# Patient Record
Sex: Female | Born: 1965 | Race: Black or African American | Hispanic: No | Marital: Married | Smoking: Never smoker
Health system: Southern US, Community
[De-identification: ages and names within clinical notes are randomized; demographics above are authoritative.]

## PROBLEM LIST (undated history)

## (undated) DIAGNOSIS — A048 Other specified bacterial intestinal infections: Secondary | ICD-10-CM

## (undated) DIAGNOSIS — E669 Obesity, unspecified: Secondary | ICD-10-CM

## (undated) DIAGNOSIS — J302 Other seasonal allergic rhinitis: Secondary | ICD-10-CM

## (undated) DIAGNOSIS — D126 Benign neoplasm of colon, unspecified: Secondary | ICD-10-CM

## (undated) DIAGNOSIS — R7303 Prediabetes: Secondary | ICD-10-CM

## (undated) DIAGNOSIS — K648 Other hemorrhoids: Secondary | ICD-10-CM

## (undated) DIAGNOSIS — K5790 Diverticulosis of intestine, part unspecified, without perforation or abscess without bleeding: Secondary | ICD-10-CM

## (undated) HISTORY — DX: Obesity, unspecified: E66.9

## (undated) HISTORY — DX: Prediabetes: R73.03

## (undated) HISTORY — DX: Other specified bacterial intestinal infections: A04.8

## (undated) HISTORY — PX: NO PAST SURGERIES: SHX2092

## (undated) HISTORY — DX: Diverticulosis of intestine, part unspecified, without perforation or abscess without bleeding: K57.90

## (undated) HISTORY — DX: Other hemorrhoids: K64.8

## (undated) HISTORY — DX: Benign neoplasm of colon, unspecified: D12.6

---

## 2002-07-16 ENCOUNTER — Ambulatory Visit (HOSPITAL_COMMUNITY): Admission: RE | Admit: 2002-07-16 | Discharge: 2002-07-16 | Payer: Self-pay | Admitting: *Deleted

## 2002-07-16 ENCOUNTER — Encounter: Payer: Self-pay | Admitting: *Deleted

## 2002-11-10 ENCOUNTER — Inpatient Hospital Stay (HOSPITAL_COMMUNITY): Admission: AD | Admit: 2002-11-10 | Discharge: 2002-11-12 | Payer: Self-pay | Admitting: *Deleted

## 2002-11-10 ENCOUNTER — Encounter: Admission: RE | Admit: 2002-11-10 | Discharge: 2002-11-10 | Payer: Self-pay | Admitting: Family Medicine

## 2004-05-03 ENCOUNTER — Ambulatory Visit (HOSPITAL_COMMUNITY): Admission: RE | Admit: 2004-05-03 | Discharge: 2004-05-03 | Payer: Self-pay | Admitting: *Deleted

## 2004-07-10 ENCOUNTER — Inpatient Hospital Stay (HOSPITAL_COMMUNITY): Admission: AD | Admit: 2004-07-10 | Discharge: 2004-07-12 | Payer: Self-pay | Admitting: *Deleted

## 2004-07-10 ENCOUNTER — Ambulatory Visit: Payer: Self-pay | Admitting: Family Medicine

## 2004-07-10 ENCOUNTER — Ambulatory Visit: Payer: Self-pay | Admitting: Obstetrics & Gynecology

## 2006-10-10 ENCOUNTER — Ambulatory Visit: Payer: Self-pay | Admitting: Internal Medicine

## 2006-10-14 ENCOUNTER — Ambulatory Visit: Payer: Self-pay | Admitting: *Deleted

## 2007-03-03 ENCOUNTER — Ambulatory Visit (HOSPITAL_COMMUNITY): Admission: RE | Admit: 2007-03-03 | Discharge: 2007-03-03 | Payer: Self-pay | Admitting: Obstetrics & Gynecology

## 2007-04-15 ENCOUNTER — Inpatient Hospital Stay (HOSPITAL_COMMUNITY): Admission: AD | Admit: 2007-04-15 | Discharge: 2007-04-17 | Payer: Self-pay | Admitting: Obstetrics & Gynecology

## 2007-04-15 ENCOUNTER — Ambulatory Visit: Payer: Self-pay | Admitting: Obstetrics & Gynecology

## 2010-12-27 LAB — CBC
Hemoglobin: 11.4 — ABNORMAL LOW
MCHC: 34.7
Platelets: 148 — ABNORMAL LOW
RBC: 3.47 — ABNORMAL LOW
WBC: 12.6 — ABNORMAL HIGH
WBC: 8.9

## 2010-12-27 LAB — RPR: RPR Ser Ql: NONREACTIVE

## 2011-10-04 ENCOUNTER — Encounter: Payer: Self-pay | Admitting: Family

## 2011-10-29 ENCOUNTER — Ambulatory Visit (INDEPENDENT_AMBULATORY_CARE_PROVIDER_SITE_OTHER): Payer: Self-pay | Admitting: Obstetrics & Gynecology

## 2011-10-29 ENCOUNTER — Encounter: Payer: Self-pay | Admitting: Obstetrics & Gynecology

## 2011-10-29 VITALS — BP 128/93 | HR 71 | Temp 98.1°F | Ht 62.0 in | Wt 177.7 lb

## 2011-10-29 DIAGNOSIS — N939 Abnormal uterine and vaginal bleeding, unspecified: Secondary | ICD-10-CM

## 2011-10-29 DIAGNOSIS — N926 Irregular menstruation, unspecified: Secondary | ICD-10-CM

## 2011-10-29 LAB — CBC
HCT: 39 % (ref 36.0–46.0)
Hemoglobin: 12.7 g/dL (ref 12.0–15.0)
MCH: 29.2 pg (ref 26.0–34.0)
MCV: 89.7 fL (ref 78.0–100.0)
Platelets: 225 10*3/uL (ref 150–400)
RBC: 4.35 MIL/uL (ref 3.87–5.11)
WBC: 4.5 10*3/uL (ref 4.0–10.5)

## 2011-10-29 NOTE — Patient Instructions (Signed)
Return to clinic for any scheduled appointments or for any gynecologic concerns as needed.   

## 2011-10-29 NOTE — Progress Notes (Signed)
History:  46 y.o. [name] here today for evaluation of irregular menses over the past few years.  Encounter done with the help of a [place] interpreter.  She reports having periods lasting 2-3 days, sometimes going 2-3 months without periods, and then having 2 periods in one month. Periods are light. Last period was 09/27/11.  No other symptoms.    The following portions of the patient's history were reviewed and updated as appropriate: allergies, current medications, past family history, past medical history, past social history, past surgical history and problem list.  Review of Systems:  A comprehensive review of systems was negative.  Objective:  Physical Exam Blood pressure 128/93, pulse 71, temperature 98.1 F (36.7 C), temperature source Oral, height 5\' 2"  (1.575 m), weight 177 lb 11.2 oz (80.604 kg), last menstrual period 09/27/2011. Gen: NAD Abd: Soft, nontender and nondistended Pelvic: Normal appearing external genitalia; normal appearing vaginal mucosa and cervix.  Normal discharge.  Small uterus, no other palpable masses, no uterine or adnexal tenderness  Assessment & Plan:  Ordered labs and pelvic ultrasound Follow up results and manage accordingly. Next step if she is still amenorrheic will be a Provera challenge, if indicated by results.

## 2011-10-29 NOTE — Progress Notes (Signed)
[employer] interpreter # [phone]

## 2011-10-30 LAB — TESTOSTERONE, FREE, TOTAL, SHBG
Sex Hormone Binding: 51 nmol/L (ref 18–114)
Testosterone, Free: 2.1 pg/mL (ref 0.6–6.8)
Testosterone-% Free: 1.3 % (ref 0.4–2.4)
Testosterone: 15.39 ng/dL (ref 10–70)

## 2011-10-30 LAB — TSH: TSH: 1.004 u[IU]/mL (ref 0.350–4.500)

## 2011-10-31 ENCOUNTER — Other Ambulatory Visit: Payer: Self-pay | Admitting: Obstetrics & Gynecology

## 2011-10-31 DIAGNOSIS — Z1231 Encounter for screening mammogram for malignant neoplasm of breast: Secondary | ICD-10-CM

## 2011-11-06 ENCOUNTER — Telehealth: Payer: Self-pay | Admitting: *Deleted

## 2011-11-06 NOTE — Telephone Encounter (Signed)
Called pt w/[employer] interpreter # [phone]. The line was answered and went to a generic voice mail- no message left. Pt needs to be called back re: pelvic US ordered by Dr. Macon Large. Ask pt if she would like to have US performed (she has no insurance), then schedule appt. Pt will also need clinic follow up w/Dr. Macon Large (in Sept.) after US done.

## 2011-11-07 NOTE — Telephone Encounter (Signed)
Called pt w/[employer] interpreter # [phone].  I explained that Dr. Macon Large had ordered an ultrasound and asked if she would like it scheduled. Pt stated that she does not have income. She does not want the ultrasound scheduled at this time. Pt states she is also a pt of [facility] and received ibuprofen Rx. She has not had any bleeding for the month of July. I told pt to call [facility] or she may call us back for future Gyn problems or appts needed. Pt voiced understanding and stated she will keep mammogram appt as scheduled.

## 2011-11-19 ENCOUNTER — Ambulatory Visit (HOSPITAL_COMMUNITY)
Admission: RE | Admit: 2011-11-19 | Discharge: 2011-11-19 | Disposition: A | Discharge status: Home / Self Care | Payer: Self-pay | Source: Ambulatory Visit | Attending: Obstetrics & Gynecology | Admitting: Obstetrics & Gynecology

## 2011-11-19 DIAGNOSIS — Z1231 Encounter for screening mammogram for malignant neoplasm of breast: Secondary | ICD-10-CM

## 2011-11-22 ENCOUNTER — Other Ambulatory Visit: Payer: Self-pay | Admitting: Obstetrics & Gynecology

## 2011-11-22 DIAGNOSIS — R928 Other abnormal and inconclusive findings on diagnostic imaging of breast: Secondary | ICD-10-CM

## 2011-12-03 ENCOUNTER — Other Ambulatory Visit: Payer: Self-pay

## 2011-12-17 ENCOUNTER — Ambulatory Visit
Admission: RE | Admit: 2011-12-17 | Discharge: 2011-12-17 | Disposition: A | Discharge status: Home / Self Care | Payer: No Typology Code available for payment source | Source: Ambulatory Visit | Attending: Obstetrics & Gynecology | Admitting: Obstetrics & Gynecology

## 2011-12-17 DIAGNOSIS — R928 Other abnormal and inconclusive findings on diagnostic imaging of breast: Secondary | ICD-10-CM

## 2011-12-19 ENCOUNTER — Telehealth: Payer: Self-pay | Admitting: *Deleted

## 2011-12-19 NOTE — Telephone Encounter (Signed)
Called patient using [place] interpreter# [phone]. Pt informed of results, voiced understanding.

## 2011-12-19 NOTE — Telephone Encounter (Signed)
Message copied by Mannie Stabile on Wed Dec 19, 2011  3:24 PM ------      Message from: Alicia Bishop A      Created: Mon Dec 17, 2011 10:15 AM       Normal breast ultrasound and diagnostic mammogram. Please call to inform patient of results.

## 2012-07-10 ENCOUNTER — Emergency Department (HOSPITAL_COMMUNITY)
Admission: EM | Admit: 2012-07-10 | Discharge: 2012-07-10 | Disposition: A | Discharge status: Home / Self Care | Payer: No Typology Code available for payment source | Source: Home / Self Care | Attending: Family Medicine | Admitting: Family Medicine

## 2012-07-10 ENCOUNTER — Encounter (HOSPITAL_COMMUNITY): Payer: Self-pay | Admitting: *Deleted

## 2012-07-10 DIAGNOSIS — J020 Streptococcal pharyngitis: Secondary | ICD-10-CM

## 2012-07-10 HISTORY — DX: Other seasonal allergic rhinitis: J30.2

## 2012-07-10 MED ORDER — CEFDINIR 300 MG PO CAPS: 300.0000 mg | ORAL_CAPSULE | Freq: Two times a day (BID) | ORAL | Status: DC

## 2012-07-10 MED ORDER — IPRATROPIUM BROMIDE 0.06 % NA SOLN: 2.0000 | Freq: Four times a day (QID) | NASAL | Status: DC

## 2012-07-10 NOTE — ED Notes (Signed)
C/o headache, dry throat and pain on both sides of her neck onset 2 weeks ago.

## 2012-07-10 NOTE — ED Provider Notes (Signed)
History     CSN: [id number]  Arrival date & time 07/10/12  1534   First MD Initiated Contact with Patient 07/10/12 1549      Chief Complaint  Patient presents with  . Headache  . Sore Throat    (Consider location/radiation/quality/duration/timing/severity/associated sxs/prior treatment) Patient is a 47 y.o. female presenting with headaches and pharyngitis. The history is provided by the patient.  Headache Pain location:  Frontal Progression:  Unchanged Chronicity:  New Associated symptoms: congestion, ear pain, facial pain, sore throat and swollen glands   Sore Throat Associated symptoms include headaches.    Past Medical History  Diagnosis Date  . Seasonal allergies   . 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 No pertinent past surgical history.  History reviewed. No pertinent family history.  History  Substance Use Topics  . Smoking status: Never Smoker   . Smokeless tobacco: Never Used  . Alcohol Use: No    OB History   Grav Para Term Preterm Abortions TAB SAB Ect Mult Living   3 3 3       3       Review of Systems  Constitutional: Negative.   HENT: Positive for ear pain, congestion, sore throat and rhinorrhea.   Respiratory: Negative.   Cardiovascular: Negative.   Neurological: Positive for headaches.    Allergies  Review of patient's allergies indicates no known allergies.  Home Medications   Current Outpatient Rx  Name  Route  Sig  Dispense  Refill  . naproxen sodium (ANAPROX) 220 MG tablet   Oral   Take 440 mg by mouth 2 (two) times daily with a meal.         . cefdinir (OMNICEF) 300 MG capsule   Oral   Take 1 capsule (300 mg total) by mouth 2 (two) times daily.   20 capsule   0   . ibuprofen (ADVIL,MOTRIN) 200 MG tablet   Oral   Take 200 mg by mouth every 6 (six) hours as needed.         Marland Kitchen ipratropium (ATROVENT) 0.06 % nasal spray   Nasal   Place 2 sprays into the nose 4 (four) times daily.   15 mL   1     BP 118/74   Pulse 77  Temp(Src) 98.4 F (36.9 C) (Oral)  Resp 16  SpO2 97%  LMP 06/10/2012  Physical Exam  Nursing note and vitals reviewed. Constitutional: She is oriented to person, place, and time. She appears well-developed and well-nourished.  HENT:  Head: Normocephalic.  Right Ear: External ear normal.  Mouth/Throat: Uvula is midline and mucous membranes are normal. Oropharyngeal exudate and posterior oropharyngeal erythema present.  Neck: Normal range of motion. Neck supple.  Cardiovascular: Regular rhythm and normal heart sounds.   Pulmonary/Chest: Breath sounds normal.  Lymphadenopathy:    She has cervical adenopathy.  Neurological: She is alert and oriented to person, place, and time.  Skin: Skin is warm and dry.    ED Course  Procedures (including critical care time)  Labs Reviewed - No data to display No results found.   1. Strep sore throat       MDM          Linna Hoff, MD 07/10/12 1731

## 2013-01-26 ENCOUNTER — Telehealth: Payer: Self-pay | Admitting: General Practice

## 2013-01-26 NOTE — Telephone Encounter (Signed)
Called pt to schedule appt to see Dr. Daleen Squibb on 01/28/13.

## 2013-01-28 ENCOUNTER — Ambulatory Visit: Payer: Self-pay | Attending: Cardiology | Admitting: Cardiology

## 2013-01-28 ENCOUNTER — Encounter: Payer: Self-pay | Admitting: Cardiology

## 2013-01-28 VITALS — BP 134/82 | HR 99 | Temp 98.5°F | Resp 16 | Ht 62.0 in | Wt 179.0 lb

## 2013-01-28 DIAGNOSIS — Z0000 Encounter for general adult medical examination without abnormal findings: Secondary | ICD-10-CM | POA: Insufficient documentation

## 2013-01-28 DIAGNOSIS — Z09 Encounter for follow-up examination after completed treatment for conditions other than malignant neoplasm: Secondary | ICD-10-CM | POA: Insufficient documentation

## 2013-01-28 NOTE — Assessment & Plan Note (Signed)
I am delighted that her blood pressure is normal today. Will establish primary care here in our clinic in November. All questions answered and patient happy with this arrangement.

## 2013-01-28 NOTE — Progress Notes (Signed)
Alicia Bishop comes today for followup of her blood pressure. It was elevated at the health care fair this past Saturday.  She has no complaints today. Her blood pressure is normal. She does not have a primary care physician which we will arrange for mid November.

## 2013-01-28 NOTE — Progress Notes (Signed)
Pt is here today to establish care. Pt reports having no C.C. Today.

## 2013-02-02 ENCOUNTER — Other Ambulatory Visit: Payer: Self-pay

## 2013-02-23 ENCOUNTER — Ambulatory Visit: Payer: Self-pay | Attending: Internal Medicine | Admitting: Internal Medicine

## 2013-02-23 ENCOUNTER — Encounter: Payer: Self-pay | Admitting: Internal Medicine

## 2013-02-23 ENCOUNTER — Ambulatory Visit: Payer: Self-pay | Admitting: Internal Medicine

## 2013-02-23 VITALS — BP 136/88 | HR 87 | Temp 99.2°F | Ht 62.0 in | Wt 178.4 lb

## 2013-02-23 DIAGNOSIS — E119 Type 2 diabetes mellitus without complications: Secondary | ICD-10-CM | POA: Insufficient documentation

## 2013-02-23 DIAGNOSIS — Z0000 Encounter for general adult medical examination without abnormal findings: Secondary | ICD-10-CM

## 2013-02-23 LAB — CBC WITH DIFFERENTIAL/PLATELET
Eosinophils Relative: 4 % (ref 0–5)
Hemoglobin: 13.2 g/dL (ref 12.0–15.0)
Lymphocytes Relative: 45 % (ref 12–46)
Lymphs Abs: 2.3 10*3/uL (ref 0.7–4.0)
MCV: 87.5 fL (ref 78.0–100.0)
Monocytes Relative: 8 % (ref 3–12)
Neutrophils Relative %: 42 % — ABNORMAL LOW (ref 43–77)
Platelets: 239 10*3/uL (ref 150–400)
RBC: 4.49 MIL/uL (ref 3.87–5.11)
WBC: 5 10*3/uL (ref 4.0–10.5)

## 2013-02-23 LAB — POCT GLYCOSYLATED HEMOGLOBIN (HGB A1C): Hemoglobin A1C: 5.2

## 2013-02-23 NOTE — Progress Notes (Signed)
Pt is here to establish care and obtain an HBA1c. Pt has a reading of 136/88 blood pressure; maybe at risk for hypertension.

## 2013-02-24 LAB — TSH: TSH: 1.771 u[IU]/mL (ref 0.350–4.500)

## 2013-02-24 LAB — URINALYSIS, COMPLETE
Bilirubin Urine: NEGATIVE
Casts: NONE SEEN
Glucose, UA: NEGATIVE mg/dL
Hgb urine dipstick: NEGATIVE
pH: 6.5 (ref 5.0–8.0)

## 2013-02-24 LAB — CMP AND LIVER
ALT: 9 U/L (ref 0–35)
AST: 13 U/L (ref 0–37)
BUN: 14 mg/dL (ref 6–23)
Calcium: 9.2 mg/dL (ref 8.4–10.5)
Chloride: 110 mEq/L (ref 96–112)
Creat: 0.66 mg/dL (ref 0.50–1.10)
Total Bilirubin: 0.3 mg/dL (ref 0.3–1.2)

## 2013-02-24 LAB — LIPID PANEL
HDL: 50 mg/dL (ref >39)
Total CHOL/HDL Ratio: 2.8 Ratio
Triglycerides: 75 mg/dL (ref <150)

## 2013-02-24 NOTE — Progress Notes (Signed)
Patient ID: Alicia Bishop, female   DOB: 01/01/1966, 47 y.o.   MRN: [id number] Patient Demographics  Alicia Bishop, is a 47 y.o. female  [id number]  [id number]  DOB - Jul 30, 1965  CC:  Chief Complaint  Patient presents with  . Establish Care    requests HBA1c       HPI: Alicia Bishop is a 47 y.o. female here today to establish medical care. She went to a health fair recently and was told she is diabetic and her blood pressure was high. She was given instruction to register on our clinic for followup. She has no complaints, very pleasant woman. She does not smoke cigarette, she does not drink alcohol. She has no significant past medical history, not on any medication. Patient has No headache, No chest pain, No abdominal pain - No Nausea, No new weakness tingling or numbness, No Cough - SOB.  No Known Allergies Past Medical History  Diagnosis Date  . Seasonal allergies   . Diabetes in pregnancy    Current Outpatient Prescriptions on File Prior to Visit  Medication Sig Dispense Refill  . cefdinir (OMNICEF) 300 MG capsule Take 1 capsule (300 mg total) by mouth 2 (two) times daily.  20 capsule  0  . ibuprofen (ADVIL,MOTRIN) 200 MG tablet Take 200 mg by mouth every 6 (six) hours as needed.      Marland Kitchen ipratropium (ATROVENT) 0.06 % nasal spray Place 2 sprays into the nose 4 (four) times daily.  15 mL  1  . naproxen sodium (ANAPROX) 220 MG tablet Take 440 mg by mouth 2 (two) times daily with a meal.       No current facility-administered medications on file prior to visit.   No family history on file. History   Social History  . Marital Status: Married    Spouse Name: N/A    Number of Children: N/A  . Years of Education: N/A   Occupational History  . Not on file.   Social History Main Topics  . Smoking status: Never Smoker   . Smokeless tobacco: Never Used  . Alcohol Use: No  . Drug Use: No  . Sexual Activity: Yes    Partners: Male    Birth Control/ Protection: None    Other Topics Concern  . Not on file   Social History Narrative  . No narrative on file    Review of Systems: Constitutional: Negative for fever, chills, diaphoresis, activity change, appetite change and fatigue. HENT: Negative for ear pain, nosebleeds, congestion, facial swelling, rhinorrhea, neck pain, neck stiffness and ear discharge.  Eyes: Negative for pain, discharge, redness, itching and visual disturbance. Respiratory: Negative for cough, choking, chest tightness, shortness of breath, wheezing and stridor.  Cardiovascular: Negative for chest pain, palpitations and leg swelling. Gastrointestinal: Negative for abdominal distention. Genitourinary: Negative for dysuria, urgency, frequency, hematuria, flank pain, decreased urine volume, difficulty urinating and dyspareunia.  Musculoskeletal: Negative for back pain, joint swelling, arthralgia and gait problem. Neurological: Negative for dizziness, tremors, seizures, syncope, facial asymmetry, speech difficulty, weakness, light-headedness, numbness and headaches.  Hematological: Negative for adenopathy. Does not bruise/bleed easily. Psychiatric/Behavioral: Negative for hallucinations, behavioral problems, confusion, dysphoric mood, decreased concentration and agitation.    Objective:   Filed Vitals:   02/23/13 1725  BP: 136/88  Pulse: 87  Temp: 99.2 F (37.3 C)    Physical Exam: Constitutional: Patient appears well-developed and well-nourished. No distress. Obese HENT: Normocephalic, atraumatic, External right and left ear normal. Oropharynx is clear and moist.  Eyes: Conjunctivae  and EOM are normal. PERRLA, no scleral icterus. Neck: Normal ROM. Neck supple. No JVD. No tracheal deviation. No thyromegaly. CVS: RRR, S1/S2 +, no murmurs, no gallops, no carotid bruit.  Pulmonary: Effort and breath sounds normal, no stridor, rhonchi, wheezes, rales.  Abdominal: Soft. BS +, no distension, tenderness, rebound or guarding.   Musculoskeletal: Normal range of motion. No edema and no tenderness.  Lymphadenopathy: No lymphadenopathy noted, cervical, inguinal or axillary Neuro: Alert. Normal reflexes, muscle tone coordination. No cranial nerve deficit. Skin: Skin is warm and dry. No rash noted. Not diaphoretic. No erythema. No pallor. Psychiatric: Normal mood and affect. Behavior, judgment, thought content normal.  Lab Results  Component Value Date   WBC 5.0 02/23/2013   HGB 13.2 02/23/2013   HCT 39.3 02/23/2013   MCV 87.5 02/23/2013   PLT 239 02/23/2013   Lab Results  Component Value Date   CREATININE 0.66 02/23/2013   BUN 14 02/23/2013   NA 143 02/23/2013   K 4.0 02/23/2013   CL 110 02/23/2013   CO2 17* 02/23/2013    Lab Results  Component Value Date   HGBA1C 5.2 02/23/2013   Lipid Panel     Component Value Date/Time   CHOL 142 02/23/2013 1757   TRIG 75 02/23/2013 1757   HDL 50 02/23/2013 1757   CHOLHDL 2.8 02/23/2013 1757   VLDL 15 02/23/2013 1757   LDLCALC 77 02/23/2013 1757       Assessment and plan:   Patient Active Problem List   Diagnosis Date Noted  . Diabetes 02/23/2013  . Preventative health care 01/28/2013  . Abnormal uterine bleeding 10/29/2011    Plan: Comprehensive metabolic panel Complete blood count and differentials Thyroid function test Lipid panel Urinalysis Hemoglobin A1c      Blood pressure today is acceptable Hemoglobin A1c 5.2  Patient was extensively counseled about nutrition and exercise No evidence of diabetes, hypertension and the moment, patient was instructed of regular checkup going forward.  Follow up in 3 months or when necessary Interpreter was used to communicate directly with patient for the entire encounter including providing detailed patient instructions.   The patient was given clear instructions to go to ER or return to medical center if symptoms don't improve, worsen or new problems develop. The patient verbalized understanding. The  patient was told to call to get lab results if they haven't heard anything in the next week.     Jeanann Lewandowsky, MD, MHA, FACP, FAAP [facility], [place] [phone]   02/24/2013, 1:00 PM

## 2013-03-16 ENCOUNTER — Other Ambulatory Visit: Payer: Self-pay | Admitting: Internal Medicine

## 2014-02-08 ENCOUNTER — Encounter: Payer: Self-pay | Admitting: Internal Medicine

## 2014-09-30 ENCOUNTER — Encounter: Payer: Self-pay | Admitting: Internal Medicine

## 2014-09-30 ENCOUNTER — Ambulatory Visit: Payer: Self-pay | Attending: Internal Medicine | Admitting: Internal Medicine

## 2014-09-30 VITALS — BP 129/84 | HR 61 | Temp 98.1°F | Ht 62.0 in | Wt 181.2 lb

## 2014-09-30 DIAGNOSIS — R7309 Other abnormal glucose: Secondary | ICD-10-CM

## 2014-09-30 DIAGNOSIS — R7303 Prediabetes: Secondary | ICD-10-CM | POA: Insufficient documentation

## 2014-09-30 DIAGNOSIS — Z791 Long term (current) use of non-steroidal anti-inflammatories (NSAID): Secondary | ICD-10-CM | POA: Insufficient documentation

## 2014-09-30 DIAGNOSIS — Z803 Family history of malignant neoplasm of breast: Secondary | ICD-10-CM

## 2014-09-30 DIAGNOSIS — Z0000 Encounter for general adult medical examination without abnormal findings: Secondary | ICD-10-CM

## 2014-09-30 LAB — COMPLETE METABOLIC PANEL WITH GFR
ALT: 12 U/L (ref 0–35)
AST: 13 U/L (ref 0–37)
Albumin: 3.9 g/dL (ref 3.5–5.2)
Alkaline Phosphatase: 80 U/L (ref 39–117)
BUN: 17 mg/dL (ref 6–23)
CALCIUM: 9 mg/dL (ref 8.4–10.5)
CHLORIDE: 107 meq/L (ref 96–112)
CO2: 24 meq/L (ref 19–32)
CREATININE: 0.69 mg/dL (ref 0.50–1.10)
GFR, Est Non African American: >89 mL/min
Glucose, Bld: 105 mg/dL — ABNORMAL HIGH (ref 70–99)
Potassium: 4.2 mEq/L (ref 3.5–5.3)
Sodium: 140 mEq/L (ref 135–145)
Total Bilirubin: 0.2 mg/dL (ref 0.2–1.2)
Total Protein: 6.9 g/dL (ref 6.0–8.3)

## 2014-09-30 LAB — CBC WITH DIFFERENTIAL/PLATELET
BASOS ABS: 0.1 10*3/uL (ref 0.0–0.1)
BASOS PCT: 1 % (ref 0–1)
EOS ABS: 0.2 10*3/uL (ref 0.0–0.7)
Eosinophils Relative: 4 % (ref 0–5)
HEMATOCRIT: 36.9 % (ref 36.0–46.0)
HEMOGLOBIN: 12.3 g/dL (ref 12.0–15.0)
LYMPHS PCT: 48 % — AB (ref 12–46)
Lymphs Abs: 2.6 10*3/uL (ref 0.7–4.0)
MCH: 29.6 pg (ref 26.0–34.0)
MCHC: 33.3 g/dL (ref 30.0–36.0)
MCV: 88.9 fL (ref 78.0–100.0)
MONOS PCT: 7 % (ref 3–12)
MPV: 10.8 fL (ref 8.6–12.4)
Monocytes Absolute: 0.4 10*3/uL (ref 0.1–1.0)
NEUTROS ABS: 2.2 10*3/uL (ref 1.7–7.7)
Neutrophils Relative %: 40 % — ABNORMAL LOW (ref 43–77)
PLATELETS: 216 10*3/uL (ref 150–400)
RBC: 4.15 MIL/uL (ref 3.87–5.11)
RDW: 14 % (ref 11.5–15.5)
WBC: 5.5 10*3/uL (ref 4.0–10.5)

## 2014-09-30 LAB — LIPID PANEL
CHOL/HDL RATIO: 2.6 ratio
CHOLESTEROL: 146 mg/dL (ref 0–200)
HDL: 56 mg/dL (ref >=46)
LDL Cholesterol: 68 mg/dL (ref 0–99)
TRIGLYCERIDES: 110 mg/dL (ref <150)
VLDL: 22 mg/dL (ref 0–40)

## 2014-09-30 LAB — TSH: TSH: 2.813 u[IU]/mL (ref 0.350–4.500)

## 2014-09-30 LAB — POCT GLYCOSYLATED HEMOGLOBIN (HGB A1C): Hemoglobin A1C: 5.7

## 2014-09-30 LAB — GLUCOSE, POCT (MANUAL RESULT ENTRY): POC GLUCOSE: 126 mg/dL — AB (ref 70–99)

## 2014-09-30 NOTE — Progress Notes (Signed)
BG=126 A1C= 5.7    Patient is here for a physical. Patient does not want a pap smear today because she is fasting.  Patient requests a referral to get a mammogram done. Patient denies any pain today. Patient is not taking any medications other than ibuprofen when needed. Patient has an interpreter with her, Kerrin Champagne, from [employer].

## 2014-09-30 NOTE — Progress Notes (Signed)
Patient ID: Alicia Bishop, female   DOB: Nov 23, 1965, 49 y.o.   MRN: [id number]   Alicia Bishop, is a 49 y.o. female  [id number]  [id number]  DOB - Aug 12, 1965  Chief Complaint  Patient presents with  . Annual Exam        Subjective:   Alicia Bishop is a 49 y.o. female here today for a follow up visit. Patient is here for annual physical examination. She is fasting today so she will not be able to have Pap smear done. She also needs mammogram. She has a family history of breast cancer in a maternal aunt. She has no significant medical history. She was told at the time during a health fair and that she may be diabetic but hemoglobin A1c has been consistently below 5.7%. She has no complaint today. She is not on any medication. Does not smoke cigarettes, she does not drink alcohol. Patient has No headache, No chest pain, No abdominal pain - No Nausea, No new weakness tingling or numbness, No Cough - SOB. She tries to eat healthy, exercise occasionally. She has 6 children in total, 3 here in the [place], 3 black home in her country, all healthy. She lives with her husband and 3 children. No domestic activities of major concerns.  Problem  Prediabetes  Annual Physical Exam  Family History of Breast Cancer in Female    ALLERGIES: No Known Allergies  PAST MEDICAL HISTORY: Past Medical History  Diagnosis Date  . Seasonal allergies   . Diabetes in pregnancy     MEDICATIONS AT HOME: Prior to Admission medications   Medication Sig Start Date End Date Taking? Authorizing Provider  ibuprofen (ADVIL,MOTRIN) 200 MG tablet Take 200 mg by mouth every 6 (six) hours as needed.   Yes Historical Provider, MD  cefdinir (OMNICEF) 300 MG capsule Take 1 capsule (300 mg total) by mouth 2 (two) times daily. Patient not taking: Reported on 09/30/2014 07/10/12   Billy Fischer, MD  ipratropium (ATROVENT) 0.06 % nasal spray Place 2 sprays into the nose 4 (four) times daily. Patient not taking:  Reported on 09/30/2014 07/10/12   Billy Fischer, MD  naproxen sodium (ANAPROX) 220 MG tablet Take 440 mg by mouth 2 (two) times daily with a meal.    Historical Provider, MD     Objective:   Filed Vitals:   09/30/14 0913  BP: 129/84  Pulse: 61  Temp: 98.1 F (36.7 C)  TempSrc: Oral  Height: 5\' 2"  (1.575 m)  Weight: 181 lb 3.2 oz (82.192 kg)  SpO2: 97%    Exam General appearance : Awake, alert, not in any distress. Speech Clear. Not toxic looking HEENT: Atraumatic and Normocephalic, pupils equally reactive to light and accomodation Neck: supple, no JVD. No cervical lymphadenopathy.  Chest:Good air entry bilaterally, no added sounds  CVS: S1 S2 regular, no murmurs.  Abdomen: Bowel sounds present, Non tender and not distended with no gaurding, rigidity or rebound. Extremities: B/L Lower Ext shows no edema, both legs are warm to touch Neurology: Awake alert, and oriented X 3, CN II-XII intact, Non focal Skin:No Rash  Data Review Lab Results  Component Value Date   HGBA1C 5.7 09/30/2014   HGBA1C 5.2 02/23/2013     Assessment & Plan   1. Prediabetes  - POCT glycosylated hemoglobin (Hb A1C) - Glucose (CBG)  2. Annual physical exam  - CBC with Differential/Platelet - COMPLETE METABOLIC PANEL WITH GFR - Lipid panel - TSH - Urinalysis, Complete  3. Family  history of breast cancer in female  - MM Digital Screening; Future  Patient have been counseled extensively about nutrition and exercise Return in about 6 months (around 04/01/2015) for Routine Follow Up.  The patient was given clear instructions to go to ER or return to medical center if symptoms don't improve, worsen or new problems develop. The patient verbalized understanding. The patient was told to call to get lab results if they haven't heard anything in the next week.   This note has been created with Surveyor, quantity. Any transcriptional errors are unintentional.     Angelica Chessman, MD, [place], [place], [place], [place] and [facility], [place]   09/30/2014, 10:08 AM

## 2014-10-01 LAB — URINALYSIS, COMPLETE
BILIRUBIN URINE: NEGATIVE
Bacteria, UA: NONE SEEN
Casts: NONE SEEN
GLUCOSE, UA: NEGATIVE mg/dL
Hgb urine dipstick: NEGATIVE
Ketones, ur: NEGATIVE mg/dL
Nitrite: NEGATIVE
PROTEIN: NEGATIVE mg/dL
SPECIFIC GRAVITY, URINE: 1.018 (ref 1.005–1.030)
Urobilinogen, UA: 0.2 mg/dL (ref 0.0–1.0)
pH: 6 (ref 5.0–8.0)

## 2014-10-04 ENCOUNTER — Telehealth: Payer: Self-pay | Admitting: *Deleted

## 2014-10-04 NOTE — Telephone Encounter (Signed)
-----   Message from Tresa Garter, MD sent at 10/01/2014  5:24 PM EDT ----- Please inform patient that her laboratory test results are mostly within normal limits.

## 2014-10-04 NOTE — Telephone Encounter (Signed)
Attempted to call patient using [employer] interpre[employer]-I was on hold for 10-15 minutes when they came on to say that they did not have a [place] interpreter.

## 2014-11-01 ENCOUNTER — Ambulatory Visit: Payer: Self-pay | Attending: Internal Medicine

## 2015-08-03 LAB — GLUCOSE, POCT (MANUAL RESULT ENTRY): POC GLUCOSE: 89 mg/dL (ref 70–99)

## 2015-12-09 DIAGNOSIS — R7303 Prediabetes: Secondary | ICD-10-CM

## 2015-12-09 DIAGNOSIS — E669 Obesity, unspecified: Secondary | ICD-10-CM

## 2015-12-09 HISTORY — DX: Prediabetes: R73.03

## 2015-12-09 HISTORY — DX: Obesity, unspecified: E66.9

## 2015-12-19 ENCOUNTER — Ambulatory Visit (INDEPENDENT_AMBULATORY_CARE_PROVIDER_SITE_OTHER): Payer: Self-pay | Admitting: Internal Medicine

## 2015-12-19 ENCOUNTER — Encounter: Payer: Self-pay | Admitting: Internal Medicine

## 2015-12-19 VITALS — BP 128/76 | HR 76 | Temp 98.1°F | Resp 16 | Ht 61.25 in | Wt 179.0 lb

## 2015-12-19 DIAGNOSIS — E663 Overweight: Secondary | ICD-10-CM | POA: Insufficient documentation

## 2015-12-19 DIAGNOSIS — Z8639 Personal history of other endocrine, nutritional and metabolic disease: Secondary | ICD-10-CM

## 2015-12-19 DIAGNOSIS — E6609 Other obesity due to excess calories: Secondary | ICD-10-CM

## 2015-12-19 DIAGNOSIS — E669 Obesity, unspecified: Secondary | ICD-10-CM

## 2015-12-19 DIAGNOSIS — R7303 Prediabetes: Secondary | ICD-10-CM

## 2015-12-19 DIAGNOSIS — Z23 Encounter for immunization: Secondary | ICD-10-CM

## 2015-12-19 LAB — GLUCOSE, POCT (MANUAL RESULT ENTRY): POC GLUCOSE: 148 mg/dL — AB (ref 70–99)

## 2015-12-19 NOTE — Patient Instructions (Signed)
Drink a glass of water before every meal Drink 6-8 glasses of water daily Eat three meals daily Eat a protein and healthy fat with every meal (eggs,fish, chicken, turkey and limit red meats) Eat 5 servings of vegetables daily, mix the colors Eat 2 servings of fruit daily with skin, if skin is edible Use smaller plates Put food/utensils down as you chew and swallow each bite Eat at a table with friends/family at least once daily, no TV Do not eat in front of the TV 

## 2015-12-19 NOTE — Progress Notes (Signed)
   Subjective:    Patient ID: Alicia Bishop, female    DOB: 07-Aug-1965, 50 y.o.   MRN: [id number]  HPI  Here to establish  1.  Prediabetes:  A1C in June of 2016 was 5.7%.  Patient states she has not made any changes to her diet or physical activity since.  Her weight is essentially unchanged.    Diet:  Up at 6 a.m.  Breakfast: Eats bread-white with tea and brown sugar and milk.  makes kids something--kids go to school. Does housecleaning and then goes back to bed at 10 a.m. Because she doesn't have anything to do. Gets up at 11 a.m. Eats lunch after noon:  Salad:  Cucumber, sweet potatoe, mayo, ranch. Drinks Sprite or [employer] Dew--will have 2 12 ounce sodas daily. Watches TV and cooks. Mashed rice or potatoes with soup.  Fish, meat or chicken. Sometimes snacks on chips. Sometimes will have Corn Flakes before going to bed. Bedtime at 11 p.m.  Cholesterol in past 2 years has been fine each time.  Current Meds  Medication Sig  . ibuprofen (ADVIL,MOTRIN) 200 MG tablet Take 200 mg by mouth every 6 (six) hours as needed.  . naproxen sodium (ANAPROX) 220 MG tablet Take 440 mg by mouth 2 (two) times daily with a meal.   No Known Allergies   Past Medical History:  Diagnosis Date  . Diabetes in pregnancy   . Seasonal allergies    History reviewed. No pertinent surgical history.   Family History  Problem Relation Age of Onset  . Hypertension Father    Social History   Social History  . Marital status: Married    Spouse name: Bettey Costa  . Number of children: 4  . Years of education: 5   Occupational History  . Housewife    Social History Main Topics  . Smoking status: Never Smoker  . Smokeless tobacco: Never Used  . Alcohol use No  . Drug use: No  . Sexual activity: Yes    Partners: Male    Birth control/ protection: Post-menopausal   Other Topics Concern  . Not on file   Social History Narrative   Originally from [place]   Came to [place] In 2003   Lives  at home with her husband, and 3 children.   Husband is a Astronomer at a country club.     Review of Systems     Objective:   Physical Exam NAD Obese HEENT:  PERRL, EOMI, TMs pearly gray, throat without injection Neck:  Supple, No adenopathy, no thyromegaly Chest: CTA CV:  RRR with normal S1 and S2, No S3, S4, or murmur.  No carotid bruit, carotid, radial and DP pulses normal and equal Abd:  S, NT, No HSM or mass, + BS LE:  No edema        Assessment & Plan:  1.  Prediabetes:  Long discussion regarding weight, lifestyle, improving diet and increasing physical activity with time--especially after getting kids off to school, possibly with other women in her neighborhood.   A1C to see where her sugar control is.  CMP.  2.  HM:  Tdap today.  Follow up in 3 months for CPE with pap

## 2015-12-20 LAB — COMPREHENSIVE METABOLIC PANEL
ALT: 11 IU/L (ref 0–32)
AST: 18 IU/L (ref 0–40)
Albumin/Globulin Ratio: 1.5 (ref 1.2–2.2)
Albumin: 4.1 g/dL (ref 3.5–5.5)
Alkaline Phosphatase: 113 IU/L (ref 39–117)
BUN/Creatinine Ratio: 21 (ref 9–23)
BUN: 16 mg/dL (ref 6–24)
Bilirubin Total: 0.2 mg/dL (ref 0.0–1.2)
CALCIUM: 9.2 mg/dL (ref 8.7–10.2)
CO2: 24 mmol/L (ref 18–29)
CREATININE: 0.77 mg/dL (ref 0.57–1.00)
Chloride: 106 mmol/L (ref 96–106)
GFR calc Af Amer: 106 mL/min/{1.73_m2} (ref >59)
GFR, EST NON AFRICAN AMERICAN: 92 mL/min/{1.73_m2} (ref >59)
GLUCOSE: 106 mg/dL — AB (ref 65–99)
Globulin, Total: 2.7 g/dL (ref 1.5–4.5)
Potassium: 4.1 mmol/L (ref 3.5–5.2)
Sodium: 144 mmol/L (ref 134–144)
Total Protein: 6.8 g/dL (ref 6.0–8.5)

## 2015-12-20 LAB — HGB A1C W/O EAG: HEMOGLOBIN A1C: 6.4 % — AB (ref 4.8–5.6)

## 2016-03-19 ENCOUNTER — Ambulatory Visit: Payer: Self-pay | Admitting: Internal Medicine

## 2016-05-07 ENCOUNTER — Encounter: Payer: Self-pay | Admitting: Internal Medicine

## 2016-05-07 ENCOUNTER — Ambulatory Visit (INDEPENDENT_AMBULATORY_CARE_PROVIDER_SITE_OTHER): Payer: Self-pay | Admitting: Internal Medicine

## 2016-05-07 ENCOUNTER — Other Ambulatory Visit: Payer: PRIVATE HEALTH INSURANCE

## 2016-05-07 VITALS — BP 128/88 | HR 70 | Resp 12 | Ht 61.0 in | Wt 167.0 lb

## 2016-05-07 DIAGNOSIS — R7303 Prediabetes: Secondary | ICD-10-CM

## 2016-05-07 DIAGNOSIS — Z1239 Encounter for other screening for malignant neoplasm of breast: Secondary | ICD-10-CM

## 2016-05-07 DIAGNOSIS — Z0000 Encounter for general adult medical examination without abnormal findings: Secondary | ICD-10-CM

## 2016-05-07 DIAGNOSIS — Z9189 Other specified personal risk factors, not elsewhere classified: Secondary | ICD-10-CM

## 2016-05-07 DIAGNOSIS — Z1231 Encounter for screening mammogram for malignant neoplasm of breast: Secondary | ICD-10-CM

## 2016-05-07 DIAGNOSIS — Z124 Encounter for screening for malignant neoplasm of cervix: Secondary | ICD-10-CM

## 2016-05-07 LAB — GLUCOSE, POCT (MANUAL RESULT ENTRY): POC GLUCOSE: 90 mg/dL (ref 70–99)

## 2016-05-07 NOTE — Progress Notes (Signed)
Subjective:    Patient ID: Alicia Bishop, female    DOB: 08/02/1965, 51 y.o.   MRN: [id number]  HPI   CPE with pap  1.  Pap: Last in 2013 and normal.    2.  Mammogram:  None since 2013, normal in past.  No SBE.  No family history  3.  Osteoprevention:  2-3 servings of milk, yogurt or cheese daily.  Walks daily for 30 minutes  4.  Guaiac Cards:  Never.  No family history.  5.  Colonoscopy:  Never  6.  Immunizations: Did not get influenza this fall. Immunization History  Administered Date(s) Administered  . Influenza Split 08/03/2015  . Tdap 12/19/2015      Review of Systems  Constitutional: Positive for activity change. Negative for fatigue and fever.  HENT: Negative for ear pain, hearing loss, rhinorrhea, sneezing and sore throat.   Eyes: Negative for pain and visual disturbance.       Uses reading glasses  Respiratory: Negative for cough and shortness of breath.   Cardiovascular: Negative for chest pain, palpitations and leg swelling.  Gastrointestinal: Negative for abdominal pain, blood in stool, constipation, diarrhea and nausea.  Endocrine: Negative for polyphagia and polyuria.  Genitourinary: Negative for dysuria and vaginal discharge.  Musculoskeletal: Negative for arthralgias.  Skin: Negative for rash.       No skin lesions with change  Neurological: Negative for weakness and numbness.  Hematological: Negative for adenopathy. Does not bruise/bleed easily.  Psychiatric/Behavioral: Negative for dysphoric mood, self-injury and suicidal ideas. The patient is not nervous/anxious.        Objective:   Physical Exam  Constitutional: She appears well-developed and well-nourished.  HENT:  Head: Normocephalic and atraumatic.  Right Ear: Hearing, tympanic membrane, external ear and ear canal normal.  Left Ear: Hearing, tympanic membrane, external ear and ear canal normal.  Nose: Nose normal.  Mouth/Throat: Uvula is midline and oropharynx is clear and moist.    Periodontal build up of plaque  Eyes: Conjunctivae and EOM are normal. Pupils are equal, round, and reactive to light.  Discs sharp bilaterally  Neck: Normal range of motion. Neck supple. No thyromegaly present.  Cardiovascular: Normal rate, regular rhythm, S1 normal and S2 normal.  Exam reveals no S3, no S4 and no friction rub.   No murmur heard. No carotid bruits.  Carotid, radial, femoral, DP and PT pulses normal and equal.   Pulmonary/Chest: Effort normal and breath sounds normal. Right breast exhibits no inverted nipple, no mass, no nipple discharge, no skin change and no tenderness. Left breast exhibits no inverted nipple, no mass, no nipple discharge, no skin change and no tenderness.  Abdominal: Soft. Bowel sounds are normal. She exhibits no mass. There is no hepatosplenomegaly. There is no tenderness. No hernia.  Genitourinary: Vagina normal and uterus normal. Rectal exam shows no mass, anal tone normal and guaiac negative stool. Cervix exhibits no motion tenderness and no discharge. Right adnexum displays no mass and no tenderness. Left adnexum displays no mass and no tenderness.  Musculoskeletal: Normal range of motion.  Lymphadenopathy:       Head (right side): No submental and no submandibular adenopathy present.       Head (left side): No submental and no submandibular adenopathy present.    She has no cervical adenopathy.    She has no axillary adenopathy.       Right: No inguinal and no supraclavicular adenopathy present.       Left: No inguinal and no  supraclavicular adenopathy present.  Neurological: She is alert. She has normal strength and normal reflexes. No cranial nerve deficit or sensory deficit. Coordination and gait normal.  Skin: Skin is warm and dry. No rash noted.  Psychiatric: She has a normal mood and affect. Her speech is normal and behavior is normal. Judgment and thought content normal. Cognition and memory are normal.          Assessment & Plan:  CPE  with pap Schedule mammogram with scholarship Return for fasting labs in 2 weeks :  FLP and repeat A1C with significant weight loss. Encouraged continued lifestyle changes. To return Guaiac cards at lab visit. Encouraged to obtain influenza vaccine at [facility] or elsewhere. Dental referral for regular dental cleaning.

## 2016-05-08 LAB — CYTOLOGY - PAP

## 2016-05-16 NOTE — Progress Notes (Signed)
Spoke with patient daughter. Results given.

## 2016-05-21 ENCOUNTER — Other Ambulatory Visit (INDEPENDENT_AMBULATORY_CARE_PROVIDER_SITE_OTHER): Payer: Self-pay

## 2016-05-21 DIAGNOSIS — R7303 Prediabetes: Secondary | ICD-10-CM

## 2016-05-21 DIAGNOSIS — Z0000 Encounter for general adult medical examination without abnormal findings: Secondary | ICD-10-CM

## 2016-05-21 DIAGNOSIS — Z1211 Encounter for screening for malignant neoplasm of colon: Secondary | ICD-10-CM

## 2016-05-21 LAB — POC HEMOCCULT BLD/STL (HOME/3-CARD/SCREEN)
Card #2 Fecal Occult Blod, POC: NEGATIVE
Card #3 Fecal Occult Blood, POC: NEGATIVE
FECAL OCCULT BLD: NEGATIVE

## 2016-05-22 LAB — HGB A1C W/O EAG: Hgb A1c MFr Bld: 5.8 % — ABNORMAL HIGH (ref 4.8–5.6)

## 2016-05-22 LAB — LIPID PANEL W/O CHOL/HDL RATIO
Cholesterol, Total: 150 mg/dL (ref 100–199)
HDL: 52 mg/dL (ref >39)
LDL Calculated: 86 mg/dL (ref 0–99)
TRIGLYCERIDES: 60 mg/dL (ref 0–149)
VLDL Cholesterol Cal: 12 mg/dL (ref 5–40)

## 2016-06-05 NOTE — Progress Notes (Signed)
Spoke with patient. Lab results given.

## 2016-06-29 LAB — GLUCOSE, POCT (MANUAL RESULT ENTRY): POC Glucose: 151 mg/dl — AB (ref 70–99)

## 2016-08-06 ENCOUNTER — Ambulatory Visit: Payer: PRIVATE HEALTH INSURANCE | Admitting: Internal Medicine

## 2016-09-10 ENCOUNTER — Encounter: Payer: Self-pay | Admitting: Internal Medicine

## 2016-09-10 ENCOUNTER — Ambulatory Visit (INDEPENDENT_AMBULATORY_CARE_PROVIDER_SITE_OTHER): Payer: Self-pay | Admitting: Internal Medicine

## 2016-09-10 VITALS — BP 124/82 | HR 64 | Resp 12 | Ht 61.0 in | Wt 158.0 lb

## 2016-09-10 DIAGNOSIS — R7303 Prediabetes: Secondary | ICD-10-CM

## 2016-09-10 DIAGNOSIS — E6609 Other obesity due to excess calories: Secondary | ICD-10-CM

## 2016-09-10 NOTE — Progress Notes (Signed)
   Subjective:    Patient ID: Alicia Bishop, female    DOB: [birth year], 51 y.o.   MRN: [id number]  HPI   1.  Obesity and prediabetes:  Has lost a total of 21 lbs.  Is walking about 20 minutes every day.  Is eating well and less.  Sounds like whole family working on this.  Her husband is on dialysis for renal failure due to uncontrolled hypertension.  He is following his own diet.   She has moved from the obese range to simply overweight  Current Meds  Medication Sig  . ibuprofen (ADVIL,MOTRIN) 200 MG tablet Take 200 mg by mouth every 6 (six) hours as needed.    No Known Allergies    Review of Systems     Objective:   Physical Exam  Looks great Lungs:  CTA CV: RRR without murmur or rub, radial pulses normal and equal Abd:  S, NT, No HSM or mass, + BS LE:  No edema       Assessment & Plan:  1.  Obesity:  Downgraded to overweight.  Doing well.  Follow up in 4 months.  2.  Prediabetes:  A1C down to 5.8% in February with initial weight loss.  A1C today.  Suspect will be in normal range.  She is nonfasting this morning--last meal around 4 a.m. With Ramadan.

## 2016-09-11 LAB — HGB A1C W/O EAG: HEMOGLOBIN A1C: 5.7 % — AB (ref 4.8–5.6)

## 2017-01-21 ENCOUNTER — Ambulatory Visit (INDEPENDENT_AMBULATORY_CARE_PROVIDER_SITE_OTHER): Payer: Self-pay | Admitting: Internal Medicine

## 2017-01-21 ENCOUNTER — Encounter: Payer: Self-pay | Admitting: Internal Medicine

## 2017-01-21 VITALS — BP 138/82 | HR 72 | Resp 12 | Ht 61.0 in | Wt 157.0 lb

## 2017-01-21 DIAGNOSIS — R7303 Prediabetes: Secondary | ICD-10-CM

## 2017-01-21 DIAGNOSIS — Z1231 Encounter for screening mammogram for malignant neoplasm of breast: Secondary | ICD-10-CM

## 2017-01-21 DIAGNOSIS — Z1239 Encounter for other screening for malignant neoplasm of breast: Secondary | ICD-10-CM

## 2017-01-21 NOTE — Progress Notes (Signed)
Order and scholarship faxed to [facility]

## 2017-01-21 NOTE — Progress Notes (Signed)
   Subjective:    Patient ID: Alicia Bishop, female    DOB: [birth year], 51 y.o.   MRN: [id number]  HPI   1.  Prediabetes:  Has lost another pound--down 10 lbs total since January and 22 lbs since 1 year ago.  Walking daily for 30 minutes.  She could add more time to get to 60 minutes daily and has a friend who lives 15 minutes away who could walk with her.  Her 3 children are all in school as well. She is trying to eat well.   Her proteins are generally fish and lamb, chicken.  She is eating lots of vegetables, but unable to quantitate.  Maybe one serving of fruit.  2.  Dental:  Could not make appt. Made.  Is waiting for her husband to get a car so she can get transportation.  She is willing to look at the bus schedules.   3.  HM:  Did not get mammogram last year.  States she was never called.  Discussed to let us know in future if she does not hear something. Influenza vaccine:  Ours is out today due to loss of power and wait for fridge temps to equilibrate.  Current Meds  Medication Sig  . ibuprofen (ADVIL,MOTRIN) 200 MG tablet Take 200 mg by mouth every 6 (six) hours as needed.   No Known Allergies   Review of Systems     Objective:   Physical Exam  NAD Lungs:  CTA CV:  RRR without murmur or rub.  Radial and DP pulses normal and equal LE:  No edema.      Assessment & Plan:  1.  Prediabetes:  Continues to lose weight.  Encouraged to have a better idea of how many servings of fruits and veggies she is eating daily.   She will add 5 minutes each week to her walk to get to 60 minutes daily and will ask her friend to walk with her. A1C today  2.  HM:  To call back for influenza vaccine.  Mammogram and scholarship completed.    3.  Dental:  Encouraged her to learn city bus routes to be able to get around on her own.

## 2017-01-22 LAB — HGB A1C W/O EAG: HEMOGLOBIN A1C: 5.4 % (ref 4.8–5.6)

## 2017-03-04 ENCOUNTER — Ambulatory Visit
Admission: RE | Admit: 2017-03-04 | Discharge: 2017-03-04 | Disposition: A | Discharge status: Home / Self Care | Payer: No Typology Code available for payment source | Source: Ambulatory Visit | Attending: Internal Medicine | Admitting: Internal Medicine

## 2017-03-04 DIAGNOSIS — Z1239 Encounter for other screening for malignant neoplasm of breast: Secondary | ICD-10-CM

## 2017-03-06 ENCOUNTER — Other Ambulatory Visit: Payer: Self-pay | Admitting: Internal Medicine

## 2017-03-06 DIAGNOSIS — R928 Other abnormal and inconclusive findings on diagnostic imaging of breast: Secondary | ICD-10-CM

## 2017-03-25 ENCOUNTER — Other Ambulatory Visit (HOSPITAL_COMMUNITY): Payer: Self-pay | Admitting: *Deleted

## 2017-03-25 DIAGNOSIS — R928 Other abnormal and inconclusive findings on diagnostic imaging of breast: Secondary | ICD-10-CM

## 2017-04-08 ENCOUNTER — Other Ambulatory Visit (HOSPITAL_COMMUNITY): Payer: Self-pay | Admitting: *Deleted

## 2017-04-08 DIAGNOSIS — R928 Other abnormal and inconclusive findings on diagnostic imaging of breast: Secondary | ICD-10-CM

## 2017-04-11 ENCOUNTER — Ambulatory Visit: Payer: No Typology Code available for payment source

## 2017-04-11 ENCOUNTER — Ambulatory Visit (HOSPITAL_COMMUNITY): Payer: Self-pay

## 2017-06-06 ENCOUNTER — Ambulatory Visit (HOSPITAL_COMMUNITY)
Admission: RE | Admit: 2017-06-06 | Discharge: 2017-06-06 | Disposition: A | Discharge status: Home / Self Care | Payer: Self-pay | Source: Ambulatory Visit | Attending: Obstetrics and Gynecology | Admitting: Obstetrics and Gynecology

## 2017-06-06 ENCOUNTER — Ambulatory Visit
Admission: RE | Admit: 2017-06-06 | Discharge: 2017-06-06 | Disposition: A | Discharge status: Home / Self Care | Payer: No Typology Code available for payment source | Source: Ambulatory Visit | Attending: Obstetrics and Gynecology | Admitting: Obstetrics and Gynecology

## 2017-06-06 ENCOUNTER — Encounter (HOSPITAL_COMMUNITY): Payer: Self-pay

## 2017-06-06 ENCOUNTER — Ambulatory Visit: Payer: No Typology Code available for payment source

## 2017-06-06 VITALS — BP 159/97

## 2017-06-06 DIAGNOSIS — Z1239 Encounter for other screening for malignant neoplasm of breast: Secondary | ICD-10-CM

## 2017-06-06 DIAGNOSIS — R928 Other abnormal and inconclusive findings on diagnostic imaging of breast: Secondary | ICD-10-CM

## 2017-06-06 NOTE — Patient Instructions (Signed)
Explained breast self awareness with Keauna Branton. Patient did not need a Pap smear today due to last Pap smear was 05/07/2016. Let her know [facility] will cover Pap smears every 3 years unless has a history of abnormal Pap smears. Referred patient to the [place] for a right breast diagnostic mammogram and possible right breast ultrasound per recommendation. Appointment scheduled for Thursday, June 06, 2017 at 1050. Sabina Dusek verbalized understanding.  Cathlyn Tersigni, Arvil Chaco, RN 12:18 PM

## 2017-06-06 NOTE — Progress Notes (Signed)
Patient referred to [facility] by the [facility] due to recommending additional imaging of the right breast. Screening mammogram completed 03/04/2017.   Pap Smear: Pap smear not completed today. Last Pap smear was 05/07/2016 at [facility] and normal. Per patient has no history of an abnormal Pap smear. Last Pap smear result is in Epic.  Physical exam: Breasts Breasts symmetrical. No skin abnormalities bilateral breasts. No nipple retraction bilateral breasts. No nipple discharge bilateral breasts. No lymphadenopathy. No lumps palpated bilateral breasts. No complaints of pain or tenderness on exam. Referred patient to the [place] for a right breast diagnostic mammogram and possible right breast ultrasound per recommendation. Appointment scheduled for Thursday, June 06, 2017 at 1050.        Pelvic/Bimanual No Pap smear completed today since last Pap smear was 05/07/2016. Pap smear not indicated per [facility] guidelines.   Smoking History: Patient has never smoked.  Patient Navigation: Patient education provided. Access to services provided for patient through [facility] program.   Colorectal Cancer Screening: Per patient has never had a colonoscopy completed. No complaints today. FIT Test given to patient to complete and return to [facility].  Breast and Cervical Cancer Risk Assessment: Patient has no family history of breast cancer, known genetic mutations, or radiation treatment to the chest before age 67. Patient has no history of cervical dysplasia, immunocompromised, or DES exposure in-utero.

## 2017-06-07 ENCOUNTER — Encounter (HOSPITAL_COMMUNITY): Payer: Self-pay | Admitting: *Deleted

## 2017-06-10 ENCOUNTER — Other Ambulatory Visit: Payer: Self-pay

## 2017-06-20 LAB — FECAL OCCULT BLOOD, IMMUNOCHEMICAL: Fecal Occult Bld: NEGATIVE

## 2017-07-08 ENCOUNTER — Encounter (HOSPITAL_COMMUNITY): Payer: Self-pay

## 2018-02-25 ENCOUNTER — Other Ambulatory Visit: Payer: Self-pay | Admitting: Obstetrics and Gynecology

## 2018-02-25 DIAGNOSIS — Z1231 Encounter for screening mammogram for malignant neoplasm of breast: Secondary | ICD-10-CM

## 2018-03-10 ENCOUNTER — Ambulatory Visit
Admission: RE | Admit: 2018-03-10 | Discharge: 2018-03-10 | Disposition: A | Discharge status: Home / Self Care | Payer: No Typology Code available for payment source | Source: Ambulatory Visit | Attending: Obstetrics and Gynecology | Admitting: Obstetrics and Gynecology

## 2018-03-10 DIAGNOSIS — Z1231 Encounter for screening mammogram for malignant neoplasm of breast: Secondary | ICD-10-CM

## 2018-12-30 ENCOUNTER — Other Ambulatory Visit: Payer: Self-pay

## 2018-12-30 ENCOUNTER — Encounter: Payer: Self-pay | Admitting: Internal Medicine

## 2018-12-30 ENCOUNTER — Ambulatory Visit: Payer: Self-pay | Admitting: Internal Medicine

## 2018-12-30 VITALS — BP 152/92 | HR 58 | Resp 18 | Ht 61.0 in | Wt 150.0 lb

## 2018-12-30 DIAGNOSIS — Z130 Encounter for screening for diseases of the blood and blood-forming organs and certain disorders involving the immune mechanism: Secondary | ICD-10-CM

## 2018-12-30 DIAGNOSIS — E663 Overweight: Secondary | ICD-10-CM

## 2018-12-30 DIAGNOSIS — R7303 Prediabetes: Secondary | ICD-10-CM

## 2018-12-30 DIAGNOSIS — R030 Elevated blood-pressure reading, without diagnosis of hypertension: Secondary | ICD-10-CM | POA: Insufficient documentation

## 2018-12-30 DIAGNOSIS — Z9189 Other specified personal risk factors, not elsewhere classified: Secondary | ICD-10-CM

## 2018-12-30 NOTE — Progress Notes (Signed)
Subjective:    Patient ID: Alicia Bishop, female   DOB: Jul 04, 1965, 53 y.o.   MRN: [id number]   HPI   Here after almost 2 years.  1.  Prediabetes/overweight:  Has changed her diet, but was walking and stopped as she thought she was losing too much weight. Discussed a better weight for her height would be under 140 lb  2.  Would like dental visit to have regular dental care.  No concerns.  3.  Elevated BP:  As above, stopped walking.  BP on recheck about the same  No outpatient medications have been marked as taking for the 12/30/18 encounter (Office Visit) with Mack Hook, MD.   No Known Allergies   Past Medical History:  Diagnosis Date  . Obesity 12/2015  . Prediabetes 12/2015   A1C 6.4%  . Seasonal allergies     History reviewed. No pertinent surgical history.  Family History  Problem Relation Age of Onset  . Hypertension Father    Social History   Socioeconomic History  . Marital status: Married    Spouse name: Bettey Costa  . Number of children: 4  . Years of education: 5  . Highest education level: Not on file  Occupational History  . Occupation: Housewife  Social Needs  . Financial resource strain: Not hard at all  . Food insecurity    Worry: Never true    Inability: Never true  . Transportation needs    Medical: No    Non-medical: No  Tobacco Use  . Smoking status: Never Smoker  . Smokeless tobacco: Never Used  Substance and Sexual Activity  . Alcohol use: No  . Drug use: No  . Sexual activity: Yes    Partners: Male    Birth control/protection: Post-menopausal  Lifestyle  . Physical activity    Days per week: 0 days    Minutes per session: 0 min  . Stress: Not at all  Relationships  . Social Herbalist on phone: Patient refused    Gets together: Patient refused    Attends religious service: Patient refused    Active member of club or organization: Patient refused    Attends meetings of clubs or organizations:  Patient refused    Relationship status: Patient refused  . Intimate partner violence    Fear of current or ex partner: No    Emotionally abused: No    Physically abused: No    Forced sexual activity: No  Other Topics Concern  . Not on file  Social History Narrative   Originally from [place]   Came to [place]. In 2003   Lives at home with her husband, and 3 children.   Raelyn Number,  lives in [place]   Husband is a Astronomer at a country club.      Review of Systems    Objective:   BP (!) 152/92 (BP Location: Left Arm, Patient Position: Sitting, Cuff Size: Normal)   Pulse (!) 58   Resp 18   Ht 5\' 1"  (1.549 m)   Wt 150 lb (68 kg)   LMP  (LMP Unknown)   BMI 28.34 kg/m   Physical Exam  NAD HEENT:  PERRL, EOMI Neck:  Supple, No adenopathy, no thyromegaly Chest:  CTA CV:  RRR with normal S1 and S2, No S3, S4 or murmur.  No carotid bruits.  Carotid, radial and DP /PT pulses normal and equal Abd:  S, NT, No HSM or mass, + BS LE: No edema.  Assessment & Plan   1.  Overweight/prediabetes:  Discussed getting rid of low fiber carbs from diet and getting back to regular walking.  Can afford to lose some weight as well to get bp and sugars under control.  A1C, FLP  2.  Dental Care:  Dental clinic referral, but discussed unlikely soon as have been closed due to pandemic since March.  3.  Elevated BP:  CMP, CBC.  Recheck bp as nurse visit.  Flu clinic in October

## 2018-12-31 LAB — COMPREHENSIVE METABOLIC PANEL
ALT: 11 IU/L (ref 0–32)
AST: 16 IU/L (ref 0–40)
Albumin/Globulin Ratio: 1.8 (ref 1.2–2.2)
Albumin: 4.2 g/dL (ref 3.8–4.9)
Alkaline Phosphatase: 89 IU/L (ref 39–117)
BUN/Creatinine Ratio: 32 — ABNORMAL HIGH (ref 9–23)
BUN: 18 mg/dL (ref 6–24)
Bilirubin Total: 0.2 mg/dL (ref 0.0–1.2)
CO2: 21 mmol/L (ref 20–29)
Calcium: 9.1 mg/dL (ref 8.7–10.2)
Chloride: 107 mmol/L — ABNORMAL HIGH (ref 96–106)
Creatinine, Ser: 0.57 mg/dL (ref 0.57–1.00)
GFR calc Af Amer: 124 mL/min/{1.73_m2} (ref >59)
GFR calc non Af Amer: 108 mL/min/{1.73_m2} (ref >59)
Globulin, Total: 2.3 g/dL (ref 1.5–4.5)
Glucose: 77 mg/dL (ref 65–99)
Potassium: 4.1 mmol/L (ref 3.5–5.2)
Sodium: 141 mmol/L (ref 134–144)
Total Protein: 6.5 g/dL (ref 6.0–8.5)

## 2018-12-31 LAB — LIPID PANEL W/O CHOL/HDL RATIO
Cholesterol, Total: 142 mg/dL (ref 100–199)
HDL: 60 mg/dL (ref >39)
LDL Chol Calc (NIH): 69 mg/dL (ref 0–99)
Triglycerides: 66 mg/dL (ref 0–149)
VLDL Cholesterol Cal: 13 mg/dL (ref 5–40)

## 2018-12-31 LAB — CBC WITH DIFFERENTIAL/PLATELET
Basophils Absolute: 0 10*3/uL (ref 0.0–0.2)
Basos: 1 %
EOS (ABSOLUTE): 0.1 10*3/uL (ref 0.0–0.4)
Eos: 3 %
Hematocrit: 37.3 % (ref 34.0–46.6)
Hemoglobin: 12 g/dL (ref 11.1–15.9)
Immature Grans (Abs): 0 10*3/uL (ref 0.0–0.1)
Immature Granulocytes: 0 %
Lymphocytes Absolute: 2.4 10*3/uL (ref 0.7–3.1)
Lymphs: 51 %
MCH: 29.3 pg (ref 26.6–33.0)
MCHC: 32.2 g/dL (ref 31.5–35.7)
MCV: 91 fL (ref 79–97)
Monocytes Absolute: 0.3 10*3/uL (ref 0.1–0.9)
Monocytes: 7 %
Neutrophils Absolute: 1.7 10*3/uL (ref 1.4–7.0)
Neutrophils: 38 %
Platelets: 196 10*3/uL (ref 150–450)
RBC: 4.09 x10E6/uL (ref 3.77–5.28)
RDW: 12.6 % (ref 11.7–15.4)
WBC: 4.6 10*3/uL (ref 3.4–10.8)

## 2018-12-31 LAB — HGB A1C W/O EAG: Hgb A1c MFr Bld: 5.7 % — ABNORMAL HIGH (ref 4.8–5.6)

## 2019-01-26 ENCOUNTER — Ambulatory Visit (INDEPENDENT_AMBULATORY_CARE_PROVIDER_SITE_OTHER): Payer: Self-pay

## 2019-01-26 ENCOUNTER — Other Ambulatory Visit: Payer: Self-pay

## 2019-01-26 VITALS — BP 130/90 | HR 60

## 2019-01-26 DIAGNOSIS — R030 Elevated blood-pressure reading, without diagnosis of hypertension: Secondary | ICD-10-CM

## 2019-05-05 ENCOUNTER — Encounter: Payer: Self-pay | Admitting: Internal Medicine

## 2019-06-29 ENCOUNTER — Encounter: Payer: Self-pay | Admitting: Internal Medicine

## 2019-07-21 ENCOUNTER — Telehealth: Payer: Self-pay

## 2019-07-21 NOTE — Telephone Encounter (Signed)
Patient called stating she was having left sided chest back pain going to her back x 3 hours with shortness of breath. Patient advised to go to ER or urgent care. Patient verbalized understanding.

## 2019-07-29 ENCOUNTER — Ambulatory Visit (INDEPENDENT_AMBULATORY_CARE_PROVIDER_SITE_OTHER): Payer: Self-pay | Admitting: Internal Medicine

## 2019-07-29 ENCOUNTER — Other Ambulatory Visit: Payer: Self-pay | Admitting: Internal Medicine

## 2019-07-29 ENCOUNTER — Other Ambulatory Visit: Payer: Self-pay

## 2019-07-29 ENCOUNTER — Encounter: Payer: Self-pay | Admitting: Internal Medicine

## 2019-07-29 VITALS — BP 128/80 | HR 74 | Resp 12 | Ht 61.0 in | Wt 155.0 lb

## 2019-07-29 DIAGNOSIS — R7303 Prediabetes: Secondary | ICD-10-CM

## 2019-07-29 DIAGNOSIS — Z124 Encounter for screening for malignant neoplasm of cervix: Secondary | ICD-10-CM

## 2019-07-29 DIAGNOSIS — Z9189 Other specified personal risk factors, not elsewhere classified: Secondary | ICD-10-CM

## 2019-07-29 DIAGNOSIS — R1013 Epigastric pain: Secondary | ICD-10-CM

## 2019-07-29 DIAGNOSIS — Z0000 Encounter for general adult medical examination without abnormal findings: Secondary | ICD-10-CM

## 2019-07-29 DIAGNOSIS — E663 Overweight: Secondary | ICD-10-CM

## 2019-07-29 DIAGNOSIS — Z1231 Encounter for screening mammogram for malignant neoplasm of breast: Secondary | ICD-10-CM

## 2019-07-29 MED ORDER — FAMOTIDINE 20 MG PO TABS: ORAL_TABLET | ORAL | 1 refills | Status: DC

## 2019-07-29 NOTE — Progress Notes (Signed)
Subjective:    Patient ID: Alicia Bishop, female   DOB: 1966/01/24, 54 y.o.   MRN: [id number]   HPI   CPE with pap  1.  Pap:  Last pap 05/07/2016 and normal  2.  Mammogram:  Last mammogram was 03/10/2018.  No family history of breast cancer.    3.  Osteoprevention:  Drinks whole milk twice daily, but causes GI upset as she gets older.  Walks for 25-30 minutes several times weekly.    4.  Guaiac Cards:  Negative in 05/2016 and 06/2017.    5.  Colonoscopy:  Never.  No family history of colon cancer.   6.  Immunizations:   Immunization History  Administered Date(s) Administered  . Influenza Split 08/03/2015  . Influenza,inj,Quad PF,6+ Mos 06/30/2016  . PFIZER SARS-COV-2 Vaccination 07/11/2019  . Tdap 12/19/2015     7.  Glucose/Cholesterol:  History of prediabetes with A1C as high as 6.4% in past.  Lat performed 9.22.2020 with result of 5.7%. Cholesterol at goal in past, last checked 12/30/2018 Lipid Panel     Component Value Date/Time   CHOL 142 12/30/2018 1047   TRIG 66 12/30/2018 1047   HDL 60 12/30/2018 1047   CHOLHDL 2.6 09/30/2014 1012   VLDL 22 09/30/2014 1012   LDLCALC 69 12/30/2018 1047   LABVLDL 13 12/30/2018 1047        Current Meds  Medication Sig  . ibuprofen (ADVIL,MOTRIN) 200 MG tablet Take 200 mg by mouth every 6 (six) hours as needed.   No Known Allergies    Past Medical History:  Diagnosis Date  . Obesity 12/2015  . Prediabetes 12/2015   A1C 6.4%  . Seasonal allergies     History reviewed. No pertinent surgical history.  Family History  Problem Relation Age of Onset  . Hypertension Father     Social History   Socioeconomic History  . Marital status: Married    Spouse name: Bettey Costa  . Number of children: 4  . Years of education: 5  . Highest education level: Not on file  Occupational History  . Occupation: Housewife  Tobacco Use  . Smoking status: Never Smoker  . Smokeless tobacco: Never Used  Substance and  Sexual Activity  . Alcohol use: No  . Drug use: No  . Sexual activity: Yes    Partners: Male    Birth control/protection: Post-menopausal  Other Topics Concern  . Not on file  Social History Narrative   Originally from [place]   Came to [place]. In 2003   Lives at home with her husband, and 3 children.   Raelyn Number,  lives in [place]   Husband is a Astronomer at a country club.   Social Determinants of Health   Financial Resource Strain: Low Risk   . Difficulty of Paying Living Expenses: Not very hard  Food Insecurity: No Food Insecurity  . Worried About Charity fundraiser in the Last Year: Never true  . Ran Out of Food in the Last Year: Never true  Transportation Needs: No Transportation Needs  . Lack of Transportation (Medical): No  . Lack of Transportation (Non-Medical): No  Physical Activity: Inactive  . Days of Exercise per Week: 0 days  . Minutes of Exercise per Session: 0 min  Stress: No Stress Concern Present  . Feeling of Stress : Not at all  Social Connections:   . Frequency of Communication with Friends and Family:   . Frequency of Social Gatherings with Friends and  Family:   . Attends Religious Services:   . Active Member of Clubs or Organizations:   . Attends Archivist Meetings:   Marland Kitchen Marital Status:   Intimate Partner Violence: Not At Risk  . Fear of Current or Ex-Partner: No  . Emotionally Abused: No  . Physically Abused: No  . Sexually Abused: No    Review of Systems  Constitutional: Negative for appetite change and fatigue.  HENT: Negative for dental problem, ear pain, hearing loss, postnasal drip, rhinorrhea, sneezing and sore throat.   Eyes: Negative for visual disturbance (Wears reading glasses.  Feels she does not need an exam or new glasses now.).  Respiratory: Negative for cough.   Cardiovascular: Positive for chest pain (See info for sharp chest pain--initially pointed to left chest, but then stated in epigastrium.). Negative for palpitations  and leg swelling.       Sharp chest pain x 2 episodes in last month.  Last week called and was told to go to ED, but she did not.   Describes as sharp and radiating straight through to back.   Lasted 4 hours.   Both episodes occurred while lying down sleeping --awakened her from sleep.  No burning in her throat.   No associated dyspnea, diaphoresis.  No recent heartburn. States she did not take anything, just lay there.  Maybe sat up.  Not clear a change in position made a big difference.  Just gradually resolved  No melena or hematochezia.  No family history of heart disease.   No symptoms currently No pain with walking before or after episodes. No swelling of legs or dyspnea since.   No worries currently. First episode occurred before Ramadan.  Gastrointestinal: Positive for abdominal pain (Mid epigastrium:  see info for chest pain.  No diarrhea or constipation as well.  Cannot recall what she ate before her pain symptoms.). Negative for blood in stool (No melena), constipation and diarrhea.  Genitourinary: Negative for dysuria, vaginal bleeding and vaginal discharge.  Musculoskeletal: Negative for arthralgias.  Skin: Negative for rash.  Neurological: Negative for weakness and numbness.  Psychiatric/Behavioral: Negative for dysphoric mood. The patient is not nervous/anxious.       Objective:   BP 128/80 (BP Location: Left Arm, Patient Position: Sitting, Cuff Size: Normal)   Pulse 74   Resp 12   Ht 5\' 1"  (1.549 m)   Wt 155 lb (70.3 kg)   LMP  (LMP Unknown)   BMI 29.29 kg/m   Physical Exam  Constitutional: She is oriented to person, place, and time. She appears well-developed and well-nourished.  HENT:  Head: Normocephalic and atraumatic.  Right Ear: Tympanic membrane, external ear and ear canal normal.  Left Ear: Tympanic membrane, external ear and ear canal normal.  Nose: Nose normal.  Mouth/Throat: Uvula is midline and oropharynx is clear and moist.  Dental discoloration   Eyes: Pupils are equal, round, and reactive to light. Conjunctivae and EOM are normal.  Neck: No thyromegaly present.  Cardiovascular: Normal rate, regular rhythm, S1 normal and S2 normal. Exam reveals no S3, no S4 and no friction rub.  No murmur heard. No carotid bruits.  Carotid, radial, femoral, DP and PT pulses normal and equal.   Pulmonary/Chest: Effort normal and breath sounds normal. Right breast exhibits no inverted nipple, no mass, no nipple discharge and no tenderness. Left breast exhibits no inverted nipple, no mass, no skin change and no tenderness.  Abdominal: Soft. Bowel sounds are normal. She exhibits no mass. There  is no hepatosplenomegaly. There is abdominal tenderness (RUQ with possibly mild Murphy's sign and more so in mid epigastrium--mild as well.  No rebound or peritoneal signs. ). No hernia.  Genitourinary:    Genitourinary Comments: Normal external female genitalia.  Mild to moderated vaginal and cervical mucosal atrophy.  No discharge.   No uterine or adnexal mass or tenderness.   Musculoskeletal:        General: Normal range of motion.     Cervical back: Full passive range of motion without pain, normal range of motion and neck supple.  Lymphadenopathy:       Head (right side): No submental and no submandibular adenopathy present.       Head (left side): No submental and no submandibular adenopathy present.    She has no cervical adenopathy.    She has no axillary adenopathy.       Right: No inguinal and no supraclavicular adenopathy present.       Left: No inguinal and no supraclavicular adenopathy present.  Neurological: She is alert and oriented to person, place, and time. She has normal strength and normal reflexes. No cranial nerve deficit or sensory deficit. Coordination and gait normal.  Skin: Skin is warm. No rash noted.  Psychiatric: She has a normal mood and affect. Her speech is normal and behavior is normal. Judgment and thought content normal.         Assessment & Plan   1.  CPE with pap:   Schedule mammogram Guaiac cards--may take 6 weeks to obtain as just starting Ramadan  2.  Prediabetes:  Weight up a bit.  Encouraged working on diet and daily physical activity.  3.  Epigastric pain/Chest pain:  ECG, CBC, CMP, FLP, Lipase.  Suspect gastritis vs less likely gallbladder etiology.  Not likely to be cardiac origin.  Famotidine 40 mg at bedtime. Follow up in 1 month.  If labs suggest gallbladder or recurrent episode on meds, will send for ultrasound.

## 2019-07-30 LAB — CBC WITH DIFFERENTIAL/PLATELET
Basophils Absolute: 0 10*3/uL (ref 0.0–0.2)
Basos: 1 %
EOS (ABSOLUTE): 0.1 10*3/uL (ref 0.0–0.4)
Eos: 3 %
Hematocrit: 36.6 % (ref 34.0–46.6)
Hemoglobin: 12 g/dL (ref 11.1–15.9)
Immature Grans (Abs): 0 10*3/uL (ref 0.0–0.1)
Immature Granulocytes: 0 %
Lymphocytes Absolute: 2.5 10*3/uL (ref 0.7–3.1)
Lymphs: 46 %
MCH: 30.5 pg (ref 26.6–33.0)
MCHC: 32.8 g/dL (ref 31.5–35.7)
MCV: 93 fL (ref 79–97)
Monocytes Absolute: 0.4 10*3/uL (ref 0.1–0.9)
Monocytes: 8 %
Neutrophils Absolute: 2.2 10*3/uL (ref 1.4–7.0)
Neutrophils: 42 %
Platelets: 199 10*3/uL (ref 150–450)
RBC: 3.94 x10E6/uL (ref 3.77–5.28)
RDW: 13.2 % (ref 11.7–15.4)
WBC: 5.3 10*3/uL (ref 3.4–10.8)

## 2019-07-30 LAB — COMPREHENSIVE METABOLIC PANEL
ALT: 13 IU/L (ref 0–32)
AST: 16 IU/L (ref 0–40)
Albumin/Globulin Ratio: 1.6 (ref 1.2–2.2)
Albumin: 3.9 g/dL (ref 3.8–4.9)
Alkaline Phosphatase: 78 IU/L (ref 39–117)
BUN/Creatinine Ratio: 23 (ref 9–23)
BUN: 16 mg/dL (ref 6–24)
Bilirubin Total: <0.2 mg/dL (ref 0.0–1.2)
CO2: 24 mmol/L (ref 20–29)
Calcium: 8.9 mg/dL (ref 8.7–10.2)
Chloride: 105 mmol/L (ref 96–106)
Creatinine, Ser: 0.71 mg/dL (ref 0.57–1.00)
GFR calc Af Amer: 113 mL/min/{1.73_m2} (ref >59)
GFR calc non Af Amer: 98 mL/min/{1.73_m2} (ref >59)
Globulin, Total: 2.5 g/dL (ref 1.5–4.5)
Glucose: 88 mg/dL (ref 65–99)
Potassium: 4.5 mmol/L (ref 3.5–5.2)
Sodium: 139 mmol/L (ref 134–144)
Total Protein: 6.4 g/dL (ref 6.0–8.5)

## 2019-07-30 LAB — HGB A1C W/O EAG: Hgb A1c MFr Bld: 5.8 % — ABNORMAL HIGH (ref 4.8–5.6)

## 2019-07-30 LAB — LIPID PANEL W/O CHOL/HDL RATIO
Cholesterol, Total: 137 mg/dL (ref 100–199)
HDL: 51 mg/dL (ref >39)
LDL Chol Calc (NIH): 65 mg/dL (ref 0–99)
Triglycerides: 120 mg/dL (ref 0–149)
VLDL Cholesterol Cal: 21 mg/dL (ref 5–40)

## 2019-07-30 LAB — LIPASE: Lipase: 28 U/L (ref 14–72)

## 2019-07-30 LAB — CYTOLOGY - PAP

## 2019-08-28 ENCOUNTER — Ambulatory Visit: Payer: Self-pay | Admitting: Internal Medicine

## 2019-09-04 ENCOUNTER — Other Ambulatory Visit (INDEPENDENT_AMBULATORY_CARE_PROVIDER_SITE_OTHER): Payer: Self-pay

## 2019-09-04 ENCOUNTER — Other Ambulatory Visit: Payer: Self-pay

## 2019-09-04 DIAGNOSIS — Z1211 Encounter for screening for malignant neoplasm of colon: Secondary | ICD-10-CM

## 2019-09-04 LAB — POC HEMOCCULT BLD/STL (HOME/3-CARD/SCREEN)
Card #2 Fecal Occult Blod, POC: NEGATIVE
Card #3 Fecal Occult Blood, POC: NEGATIVE
Fecal Occult Blood, POC: NEGATIVE

## 2019-10-13 ENCOUNTER — Ambulatory Visit: Payer: Self-pay | Admitting: Internal Medicine

## 2019-12-14 IMAGING — MG DIGITAL SCREENING BILATERAL MAMMOGRAM WITH CAD
4 series · 4 of 4 positions shown · non-contrast
Comparison: Previous exam(s).

CLINICAL DATA: Screening.

EXAM:
DIGITAL SCREENING BILATERAL MAMMOGRAM WITH CAD

[L MLO]
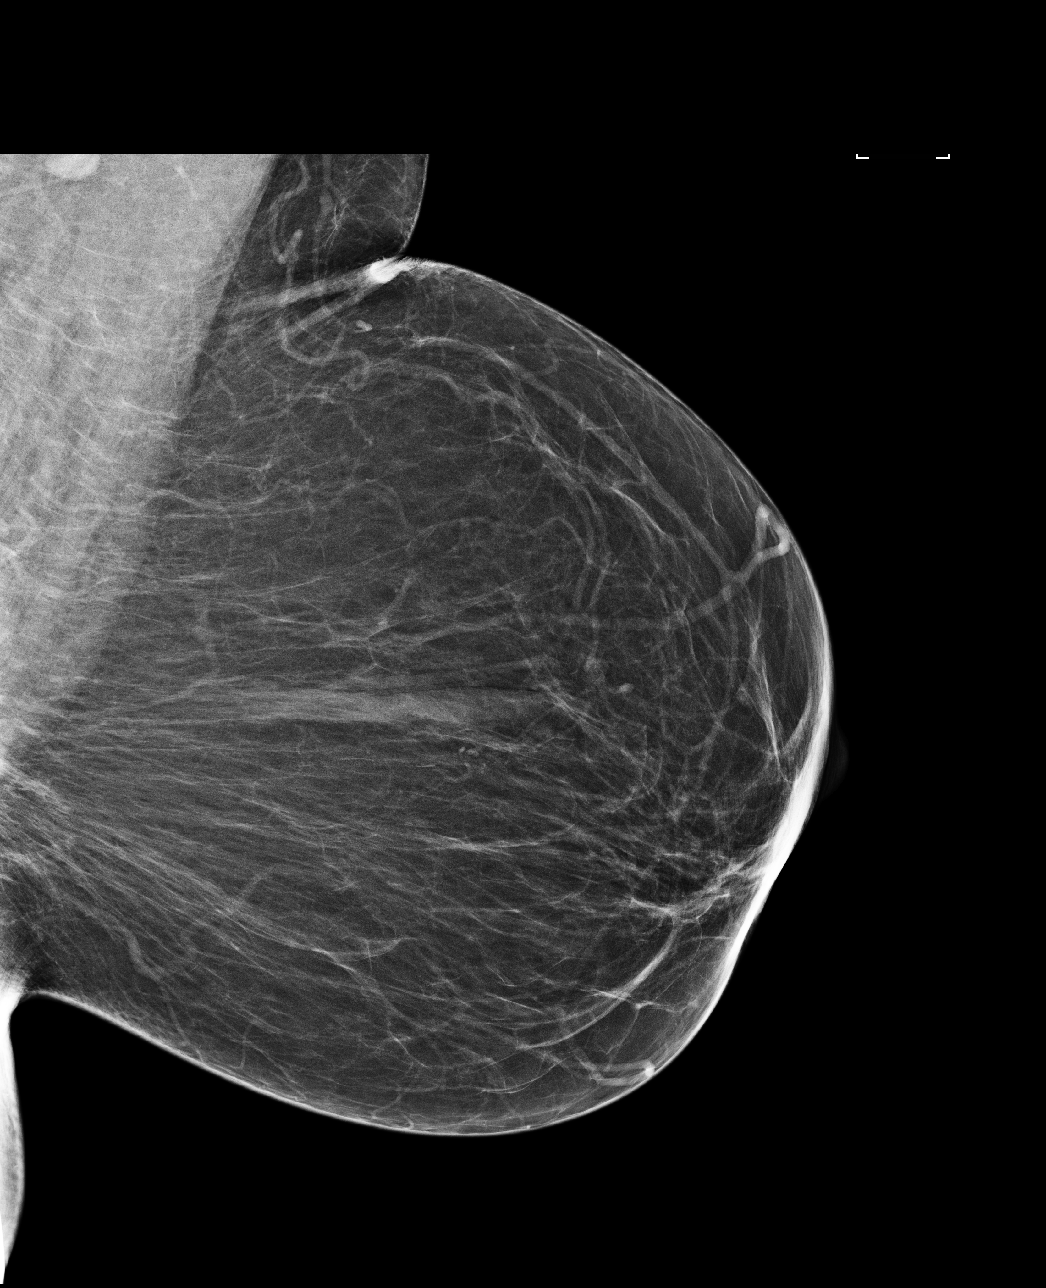

[L CC]
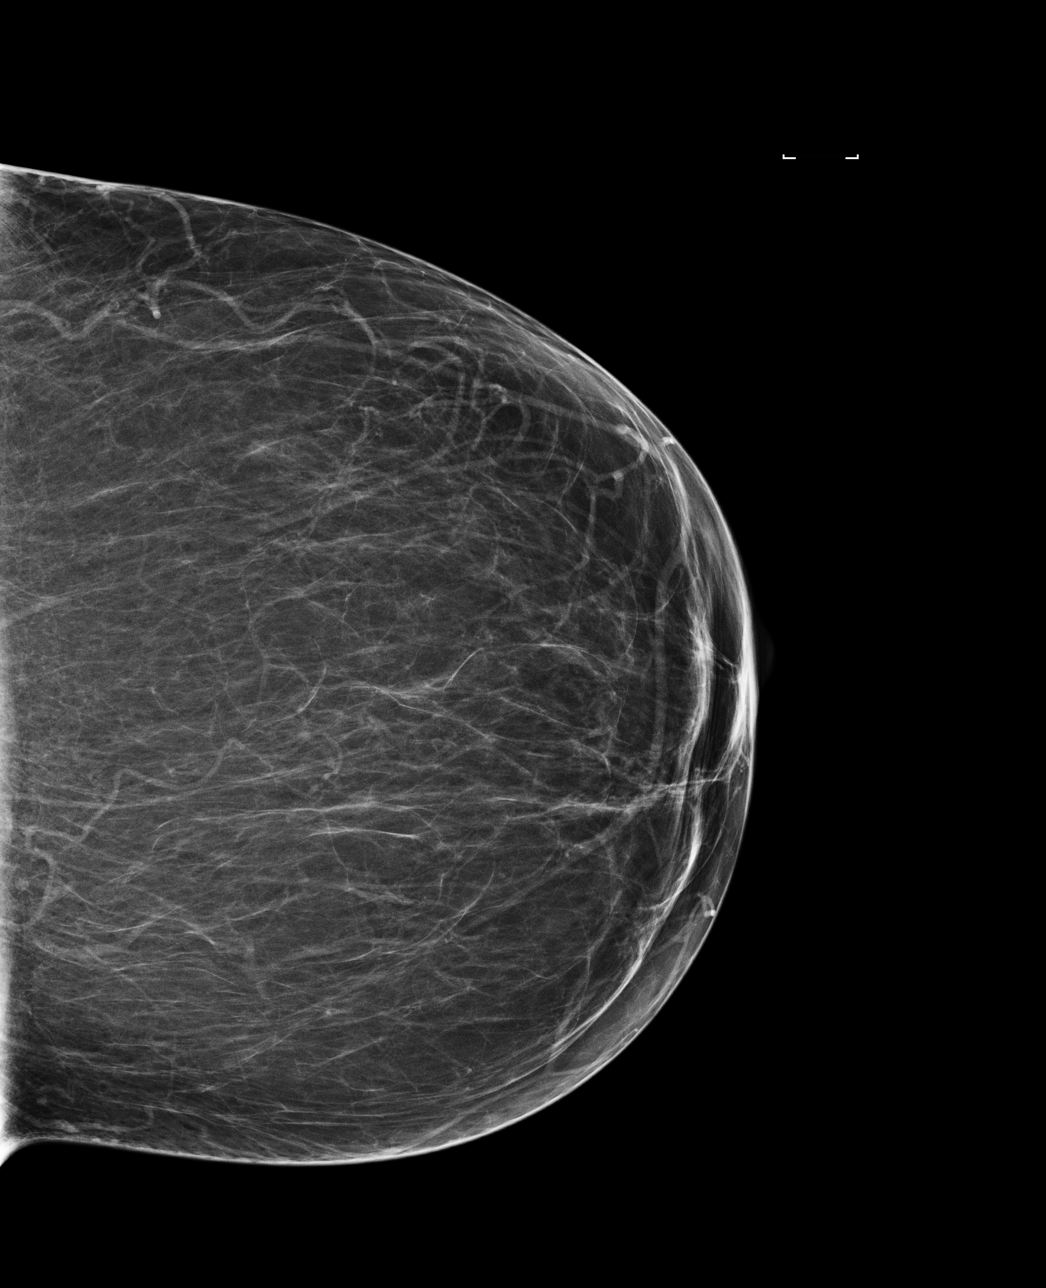

[R MLO]
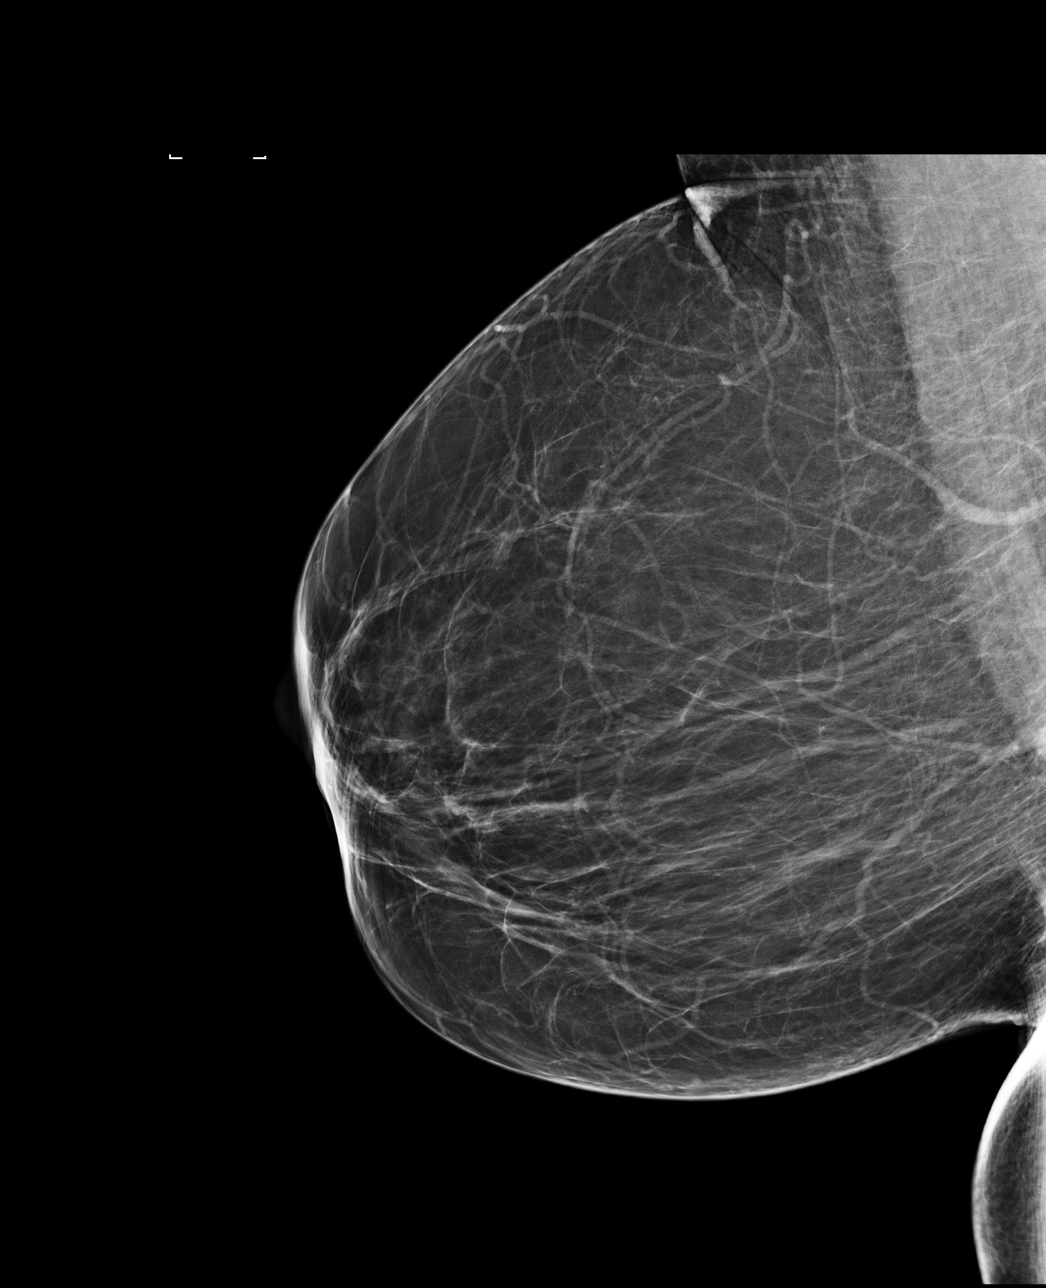

[R CC]
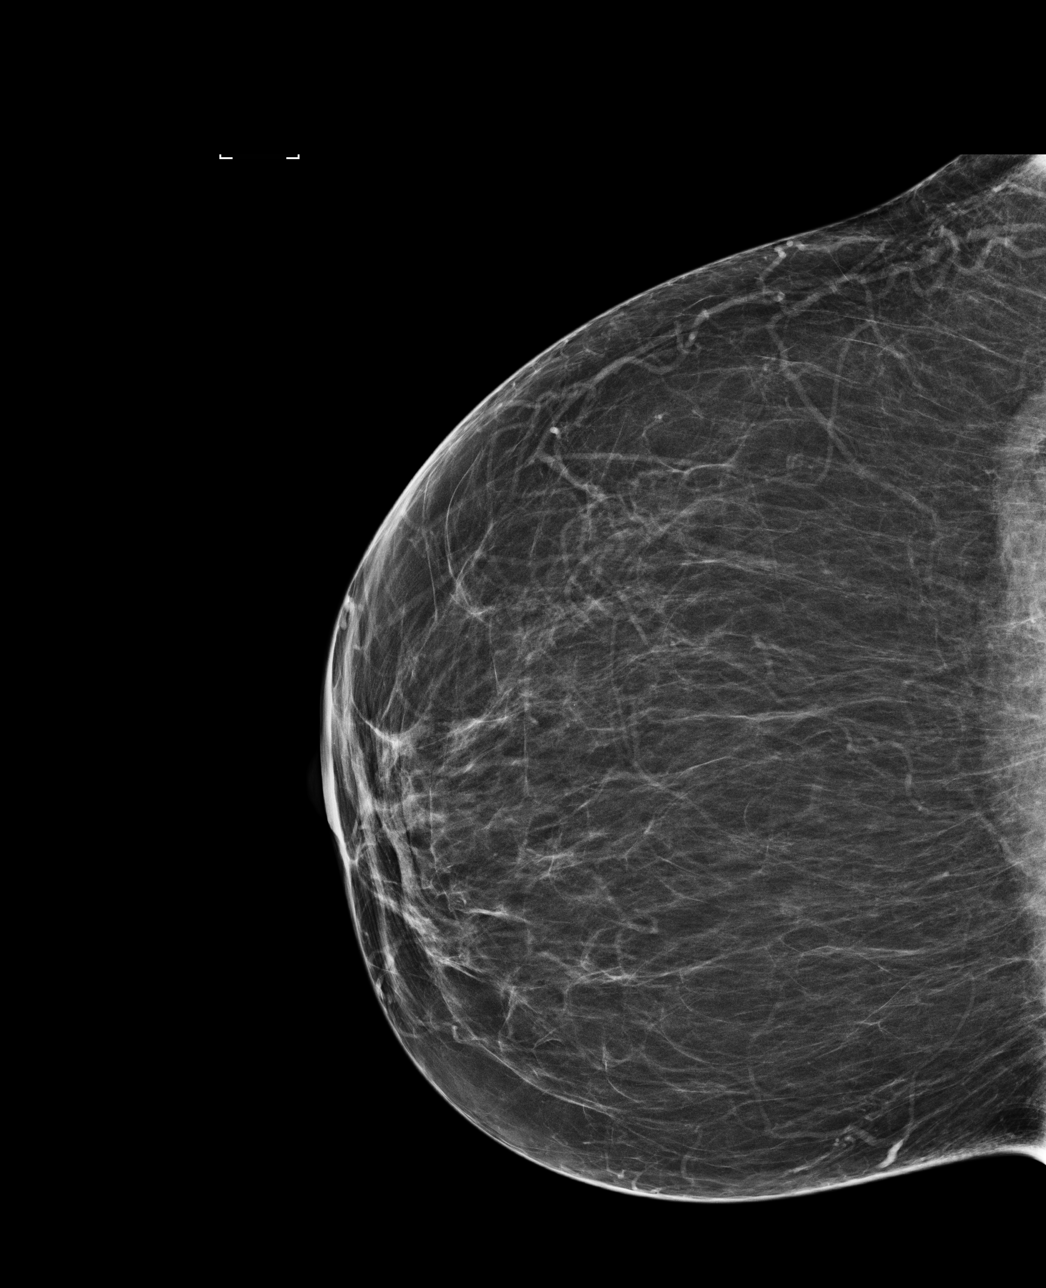

[4 of 4 positions shown; findings below may reference images not displayed]

ACR Breast Density Category b: There are scattered areas of
fibroglandular density.
FINDINGS: There are no findings suspicious for malignancy. Images were
processed with CAD.
IMPRESSION: No mammographic evidence of malignancy. A result letter of this
screening mammogram will be mailed directly to the patient.

RECOMMENDATION:
Screening mammogram in one year. (Code:AS-G-LCT)

BI-RADS CATEGORY  1: Negative.

## 2021-01-18 ENCOUNTER — Emergency Department (HOSPITAL_COMMUNITY): Payer: No Typology Code available for payment source

## 2021-01-18 ENCOUNTER — Emergency Department (HOSPITAL_COMMUNITY)
Admission: EM | Admit: 2021-01-18 | Discharge: 2021-01-19 | Disposition: A | Discharge status: Home / Self Care | Payer: No Typology Code available for payment source | Attending: Emergency Medicine | Admitting: Emergency Medicine

## 2021-01-18 DIAGNOSIS — R220 Localized swelling, mass and lump, head: Secondary | ICD-10-CM | POA: Insufficient documentation

## 2021-01-18 DIAGNOSIS — I10 Essential (primary) hypertension: Secondary | ICD-10-CM | POA: Insufficient documentation

## 2021-01-18 DIAGNOSIS — R1013 Epigastric pain: Secondary | ICD-10-CM | POA: Insufficient documentation

## 2021-01-18 DIAGNOSIS — R1010 Upper abdominal pain, unspecified: Secondary | ICD-10-CM | POA: Insufficient documentation

## 2021-01-18 DIAGNOSIS — M549 Dorsalgia, unspecified: Secondary | ICD-10-CM | POA: Insufficient documentation

## 2021-01-18 DIAGNOSIS — N9489 Other specified conditions associated with female genital organs and menstrual cycle: Secondary | ICD-10-CM | POA: Insufficient documentation

## 2021-01-18 LAB — I-STAT BETA HCG BLOOD, ED (MC, WL, AP ONLY): I-stat hCG, quantitative: <5 m[IU]/mL (ref <5)

## 2021-01-18 NOTE — ED Triage Notes (Signed)
Pt reports has been having high blood pressure since last Thursday as well as CP. Pt reports pain comes and goes from substernal chest radiating to the back. Pt reports diaphoresis with the pain. Denies SOB, nausea/vomiting. Denies cardiac hx. Pt NAD at present.

## 2021-01-19 ENCOUNTER — Other Ambulatory Visit: Payer: Self-pay

## 2021-01-19 LAB — BASIC METABOLIC PANEL
Anion gap: 10 (ref 5–15)
BUN: 18 mg/dL (ref 6–20)
CO2: 22 mmol/L (ref 22–32)
Calcium: 9.1 mg/dL (ref 8.9–10.3)
Chloride: 107 mmol/L (ref 98–111)
Creatinine, Ser: 0.86 mg/dL (ref 0.44–1.00)
GFR, Estimated: >60 mL/min (ref >60)
Glucose, Bld: 102 mg/dL — ABNORMAL HIGH (ref 70–99)
Potassium: 3.8 mmol/L (ref 3.5–5.1)
Sodium: 139 mmol/L (ref 135–145)

## 2021-01-19 LAB — CBC
HCT: 40 % (ref 36.0–46.0)
Hemoglobin: 12.8 g/dL (ref 12.0–15.0)
MCH: 29.8 pg (ref 26.0–34.0)
MCHC: 32 g/dL (ref 30.0–36.0)
MCV: 93 fL (ref 80.0–100.0)
Platelets: 262 10*3/uL (ref 150–400)
RBC: 4.3 MIL/uL (ref 3.87–5.11)
RDW: 12.8 % (ref 11.5–15.5)
WBC: 8.3 10*3/uL (ref 4.0–10.5)
nRBC: 0 % (ref 0.0–0.2)

## 2021-01-19 LAB — TROPONIN I (HIGH SENSITIVITY)
Troponin I (High Sensitivity): 10 ng/L (ref <18)
Troponin I (High Sensitivity): 10 ng/L (ref <18)

## 2021-01-19 MED ORDER — PANTOPRAZOLE SODIUM 40 MG PO TBEC: 40.0000 mg | DELAYED_RELEASE_TABLET | Freq: Every day | ORAL | 0 refills | Status: DC

## 2021-01-19 NOTE — Discharge Instructions (Addendum)
You will need further testing for a definite diagnosis of why you have recurrent abdominal pain. Take Protonix daily as prescribed and call [facility] to schedule a time to be seen for outpatient evaluation.   Return to the ED with any high fever, severe pain, vomiting, blood in your stool or for new concern.

## 2021-01-19 NOTE — ED Provider Notes (Signed)
[facility] EMERGENCY DEPARTMENT Provider Note   CSN: [id number] Arrival date & time: 01/18/21  2148     History Chief Complaint  Patient presents with   Hypertension   Chest Pain    Alicia Bishop is a 55 y.o. female.  Patient to ED for evaluation of upper abdominal pain for the past week. Symptoms are recurrent and started over one year ago. She feels when she eats it sits in her stomach and she feels full for a long time. The pain radiates into the back. She denies nausea or vomiting but has diarrhea. No blood in her stools, no melena. No chest pain, SOB. No previous abdominal surgeries. She states her doctor has given her medication for it in the past but they did not work. She denies every having endoscopic evaluation. Today she felt there was swelling in her face prompting ED evaluation.   The history is provided by the patient. No language interpreter was used.  Hypertension Associated symptoms include abdominal pain. Pertinent negatives include no chest pain and no shortness of breath.  Chest Pain Associated symptoms: abdominal pain and back pain   Associated symptoms: no fever, no nausea, no shortness of breath, no vomiting and no weakness       Past Medical History:  Diagnosis Date   Obesity 12/2015   Prediabetes 12/2015   A1C 6.4%   Seasonal allergies     Patient Active Problem List   Diagnosis Date Noted   Epigastric pain 07/29/2019   Elevated BP without diagnosis of hypertension 12/30/2018   Overweight (BMI 25.0-29.9) 12/19/2015   Prediabetes 09/30/2014   Annual physical exam 09/30/2014   Family history of breast cancer in female 09/30/2014   Preventative health care 01/28/2013    No past surgical history on file.   OB History     Gravida  3   Para  3   Term  3   Preterm      AB      Living  3      SAB      IAB      Ectopic      Multiple      Live Births              Family History  Problem Relation Age  of Onset   Hypertension Father     Social History   Tobacco Use   Smoking status: Never   Smokeless tobacco: Never  Vaping Use   Vaping Use: Never used  Substance Use Topics   Alcohol use: No   Drug use: No    Home Medications Prior to Admission medications   Medication Sig Start Date End Date Taking? Authorizing Provider  famotidine (PEPCID) 20 MG tablet 2 tabs by mouth at bedtime daily. 07/29/19   Mack Hook, MD  ibuprofen (ADVIL,MOTRIN) 200 MG tablet Take 200 mg by mouth every 6 (six) hours as needed.    [provider]    Allergies    Patient has no known allergies.  Review of Systems   Review of Systems  Constitutional:  Negative for appetite change and fever.  HENT:  Positive for facial swelling.   Respiratory:  Negative for shortness of breath.   Cardiovascular:  Negative for chest pain.  Gastrointestinal:  Positive for abdominal pain and diarrhea. Negative for blood in stool, nausea and vomiting.  Musculoskeletal:  Positive for back pain.  Skin:  Negative for color change.  Neurological:  Negative for weakness.  Physical Exam Updated Vital Signs BP (!) 156/105   Pulse 74   Temp 98.1 F (36.7 C) (Oral)   Resp 16   Ht 5\' 2"  (1.575 m)   Wt 68 kg   LMP  (LMP Unknown)   SpO2 98%   BMI 27.44 kg/m   Physical Exam Constitutional:      General: She is not in acute distress.    Appearance: She is well-developed. She is obese.  HENT:     Head: Normocephalic.  Cardiovascular:     Rate and Rhythm: Normal rate and regular rhythm.     Heart sounds: No murmur heard. Pulmonary:     Effort: Pulmonary effort is normal.     Breath sounds: Normal breath sounds. No wheezing, rhonchi or rales.  Abdominal:     General: Bowel sounds are normal.     Palpations: Abdomen is soft.     Tenderness: There is abdominal tenderness (Epigastric tenderness. No RUQ tenderness of the abdomen.). There is no guarding or rebound.  Musculoskeletal:        General:  Normal range of motion.     Cervical back: Normal range of motion and neck supple.  Skin:    General: Skin is warm and dry.  Neurological:     General: No focal deficit present.     Mental Status: She is alert and oriented to person, place, and time.    ED Results / Procedures / Treatments   Labs (all labs ordered are listed, but only abnormal results are displayed) Labs Reviewed  BASIC METABOLIC PANEL - Abnormal; Notable for the following components:      Result Value   Glucose, Bld 102 (*)    All other components within normal limits  CBC  I-STAT BETA HCG BLOOD, ED ([facility], [facility], [facility] ONLY)  TROPONIN I (HIGH SENSITIVITY)  TROPONIN I (HIGH SENSITIVITY)    EKG EKG Interpretation  Date/Time:  Wednesday January 18 2021 23:42:35 EDT Ventricular Rate:  74 PR Interval:  152 QRS Duration: 72 QT Interval:  386 QTC Calculation: 428 R Axis:   53 Text Interpretation: Normal sinus rhythm Nonspecific ST and T wave abnormality Abnormal ECG No previous ECGs available Confirmed by Ripley Fraise [phone]) on 01/19/2021 2:20:15 AM  Radiology DG Chest 2 View  Result Date: 01/19/2021 CLINICAL DATA:  Chest pain and high blood pressure. EXAM: CHEST - 2 VIEW COMPARISON:  None. FINDINGS: The heart size and mediastinal contours are within normal limits. Both lungs are clear. The visualized skeletal structures are unremarkable. IMPRESSION: No active cardiopulmonary disease. Electronically Signed   By: Lucienne Capers M.D.   On: 01/19/2021 00:10    Procedures Procedures   Medications Ordered in ED Medications - No data to display  ED Course  I have reviewed the triage vital signs and the nursing notes.  Pertinent labs & imaging results that were available during my care of the patient were reviewed by me and considered in my medical decision making (see chart for details).    MDM Rules/Calculators/A&P                           Patient to ED with recurrent epigastric abdominal pain as  detailed in the HPI. She specifically denies CP, SOB.   Patient is very well appearing. No significant tenderness on exam. VSS, mild hypertension initially that has normalized without intervention.   Labs unremarkable, EKG NSR without ischemic change, CXR clear. Feel symptoms are result  of reflux vs ulcer. Doubt gall bladder, ACS.   She will need to follow up with GI. Will start Protonix and provide referral.   Final Clinical Impression(s) / ED Diagnoses Final diagnoses:  None   Epigastric abdominal pain, recurrent  Rx / DC Orders ED Discharge Orders     None        Charlann Lange, PA-C 01/19/21 0437    Ripley Fraise, MD 01/19/21 [phone]

## 2021-02-14 ENCOUNTER — Encounter: Payer: Self-pay | Admitting: Internal Medicine

## 2021-02-14 ENCOUNTER — Other Ambulatory Visit (INDEPENDENT_AMBULATORY_CARE_PROVIDER_SITE_OTHER): Payer: No Typology Code available for payment source

## 2021-02-14 ENCOUNTER — Ambulatory Visit (INDEPENDENT_AMBULATORY_CARE_PROVIDER_SITE_OTHER): Payer: Self-pay | Admitting: Internal Medicine

## 2021-02-14 DIAGNOSIS — R1310 Dysphagia, unspecified: Secondary | ICD-10-CM

## 2021-02-14 DIAGNOSIS — R110 Nausea: Secondary | ICD-10-CM

## 2021-02-14 DIAGNOSIS — R1013 Epigastric pain: Secondary | ICD-10-CM

## 2021-02-14 DIAGNOSIS — R197 Diarrhea, unspecified: Secondary | ICD-10-CM

## 2021-02-14 LAB — TSH: TSH: 0.86 u[IU]/mL (ref 0.35–5.50)

## 2021-02-14 LAB — C-REACTIVE PROTEIN: CRP: <1 mg/dL (ref 0.5–20.0)

## 2021-02-14 NOTE — Progress Notes (Signed)
Chief Complaint: Ab pain, diarrhea  HPI : 55 year old female with history of obesity presents with ab pain.   She was seen in the ED on 01/19/21 for epigastric ab pain. She was started on Protonix but has not seen significant symptomatic improvement.   When she eats, the food doesn't go down in the lower chest. She also has had issues with ab discomfort. Her abdominal pain is located in the epigastric area and radiates to her back. She has been dealing with the pain for the last 2 years. The pain comes and goes. Her abdominal discomfort is not associated with eating. She has also been having issues with diarrhea. Has on average about 4-5 Bms per day. Will have BMs take wake her up from sleep. Denies weight loss. Any time that she eats and she doesn't go down, her tummy hurts. She takes Pepto Bismol that seems to help with her symptoms. Denies melena or hematochezia. Denies vomiting. Denies prior surgeries. Denies knowing about any fam hx of GI issues. Denies EGD or colonoscopy.  Patient is from [place]  Wt Readings from Last 3 Encounters:  02/14/21 170 lb 6 oz (77.3 kg)  01/18/21 150 lb (68 kg)  07/29/19 155 lb (70.3 kg)    Past Medical History:  Diagnosis Date   Obesity 12/2015   Prediabetes 12/2015   A1C 6.4%   Seasonal allergies      History reviewed. No pertinent surgical history. Family History  Problem Relation Age of Onset   Hypertension Father    Social History   Tobacco Use   Smoking status: Never   Smokeless tobacco: Never  Vaping Use   Vaping Use: Never used  Substance Use Topics   Alcohol use: No   Drug use: No   Current Outpatient Medications  Medication Sig Dispense Refill   amLODipine (NORVASC) 10 MG tablet Take 10 mg by mouth daily.     pantoprazole (PROTONIX) 40 MG tablet Take 1 tablet (40 mg total) by mouth daily. 60 tablet 0   No current facility-administered medications for this visit.   No Known Allergies   Review of Systems: All systems  reviewed and negative except where noted in HPI.   Physical Exam: BP 140/78   Pulse 75   Ht 5\' 2"  (1.575 m)   Wt 170 lb 6 oz (77.3 kg)   LMP  (LMP Unknown)   BMI 31.16 kg/m  Constitutional: Pleasant,well-developed, female in no acute distress. HEENT: Normocephalic and atraumatic. Conjunctivae are normal. No scleral icterus. Cardiovascular: Normal rate, regular rhythm.  Pulmonary/chest: Effort normal and breath sounds normal. No wheezing, rales or rhonchi. Abdominal: Soft, nondistended, tender in the epigastric area. Bowel sounds active throughout. There are no masses palpable. No hepatomegaly. Extremities: No edema Neurological: Alert and oriented to person place and time. Skin: Skin is warm and dry. No rashes noted. Psychiatric: Normal mood and affect. Behavior is normal.  Labs 07/2019: Lipase 28  Labs 01/2021: CBC nml, BMP unremarkable  DG Chest 2 View  Result Date: 01/19/2021 CLINICAL DATA:  Chest pain and high blood pressure. EXAM: CHEST - 2 VIEW COMPARISON:  None. FINDINGS: The heart size and mediastinal contours are within normal limits. Both lungs are clear. The visualized skeletal structures are unremarkable. IMPRESSION: No active cardiopulmonary disease. Electronically Signed   By: Lucienne Capers M.D.   On: 01/19/2021 00:10     ASSESSMENT AND PLAN: Diarrhea Epigastric abdominal pain Dysphagia Nausea Patient presents with a primary complaint of epigastric abdominal pain  as well as diarrhea.  Was incidentally noted to also have some issues with dysphagia and nausea.  Unclear at this time what is causing her symptoms.  Could consider PUD, GERD, or infection. We will plan for a broad work-up to start off with. - Check TSH, celiac panel, CRP - Check GI pathogen panel, C dif, O&P - Check abdominal U/S - EGD/colonoscopy - RTC 3 months  [name], MD

## 2021-02-14 NOTE — Patient Instructions (Addendum)
If you are age 53 or older, your body mass index should be between 23-30. Your Body mass index is 31.16 kg/m. If this is out of the aforementioned range listed, please consider follow up with your Primary Care Provider.  If you are age 26 or younger, your body mass index should be between 19-25. Your Body mass index is 31.16 kg/m. If this is out of the aformentioned range listed, please consider follow up with your Primary Care Provider.   ________________________________________________________  The [facility] providers would like to encourage you to use [facility] to communicate with providers for non-urgent requests or questions.  Due to long hold times on the telephone, sending your provider a message by [facility] may be a faster and more efficient way to get a response.  Please allow 48 business hours for a response.  Please remember that this is for non-urgent requests.  _______________________________________________________  Your provider has requested that you go to the basement level for lab work before leaving today. Press "B" on the elevator. The lab is located at the first door on the left as you exit the elevator.  You have been scheduled for an endoscopy and colonoscopy. Please follow the written instructions given to you at your visit today. Please pick up your prep supplies at the pharmacy within the next 1-3 days. If you use inhalers (even only as needed), please bring them with you on the day of your procedure.  Due to recent changes in healthcare laws, you may see the results of your imaging and laboratory studies on MyChart before your provider has had a chance to review them.  We understand that in some cases there may be results that are confusing or concerning to you. Not all laboratory results come back in the same time frame and the provider may be waiting for multiple results in order to interpret others.  Please give [place] 48 hours in order for your provider to thoroughly review  all the results before contacting the office for clarification of your results.    You will need to follow up in our office in 3 months (Feb 2023). We will contact you to schedule this appointment.   Thank you for entrusting me with your care and choosing [facility].  Dr Lorenso Courier

## 2021-02-15 LAB — TISSUE TRANSGLUTAMINASE, IGA: (tTG) Ab, IgA: <1 U/mL

## 2021-02-15 LAB — IGA: Immunoglobulin A: 226 mg/dL (ref 47–310)

## 2021-02-17 ENCOUNTER — Other Ambulatory Visit: Payer: No Typology Code available for payment source

## 2021-02-17 DIAGNOSIS — R1310 Dysphagia, unspecified: Secondary | ICD-10-CM

## 2021-02-17 DIAGNOSIS — R1013 Epigastric pain: Secondary | ICD-10-CM

## 2021-02-17 DIAGNOSIS — R110 Nausea: Secondary | ICD-10-CM

## 2021-02-17 DIAGNOSIS — R197 Diarrhea, unspecified: Secondary | ICD-10-CM

## 2021-02-20 ENCOUNTER — Ambulatory Visit (HOSPITAL_COMMUNITY): Admission: RE | Admit: 2021-02-20 | Payer: Self-pay | Source: Ambulatory Visit

## 2021-02-21 LAB — OVA AND PARASITE EXAMINATION
CONCENTRATE RESULT:: NONE SEEN
MICRO NUMBER:: 12626368
SPECIMEN QUALITY:: ADEQUATE
TRICHROME RESULT:: NONE SEEN

## 2021-02-21 LAB — GI PROFILE, STOOL, PCR

## 2021-02-23 ENCOUNTER — Other Ambulatory Visit: Payer: Self-pay | Admitting: Internal Medicine

## 2021-03-17 ENCOUNTER — Other Ambulatory Visit: Payer: Self-pay

## 2021-03-17 ENCOUNTER — Encounter: Payer: Self-pay | Admitting: Internal Medicine

## 2021-03-17 ENCOUNTER — Ambulatory Visit (AMBULATORY_SURGERY_CENTER): Payer: Self-pay | Admitting: Internal Medicine

## 2021-03-17 VITALS — BP 126/77 | HR 67 | Temp 96.9°F | Resp 13 | Ht 62.0 in | Wt 170.0 lb

## 2021-03-17 DIAGNOSIS — B9681 Helicobacter pylori [H. pylori] as the cause of diseases classified elsewhere: Secondary | ICD-10-CM

## 2021-03-17 DIAGNOSIS — R1310 Dysphagia, unspecified: Secondary | ICD-10-CM

## 2021-03-17 DIAGNOSIS — R197 Diarrhea, unspecified: Secondary | ICD-10-CM

## 2021-03-17 DIAGNOSIS — K2950 Unspecified chronic gastritis without bleeding: Secondary | ICD-10-CM

## 2021-03-17 DIAGNOSIS — D120 Benign neoplasm of cecum: Secondary | ICD-10-CM

## 2021-03-17 DIAGNOSIS — Z1211 Encounter for screening for malignant neoplasm of colon: Secondary | ICD-10-CM

## 2021-03-17 DIAGNOSIS — K219 Gastro-esophageal reflux disease without esophagitis: Secondary | ICD-10-CM

## 2021-03-17 DIAGNOSIS — K2970 Gastritis, unspecified, without bleeding: Secondary | ICD-10-CM

## 2021-03-17 MED ORDER — PANTOPRAZOLE SODIUM 40 MG PO TBEC
40.0000 mg | DELAYED_RELEASE_TABLET | Freq: Two times a day (BID) | ORAL | 1 refills | Status: DC
  Filled 2021-03-17: qty 60, 30d supply, fill #0

## 2021-03-17 MED ORDER — SODIUM CHLORIDE 0.9 % IV SOLN: 500.0000 mL | Freq: Once | INTRAVENOUS | Status: DC

## 2021-03-17 NOTE — Op Note (Signed)
[facility] Patient Name: Alicia Bishop Procedure Date: 03/17/2021 4:25 PM MRN: [id number] Endoscopist: Sonny Masters "Alicia Bishop ,  Age: 55 Referring MD:  Date of Birth: [birth year] Gender: Female Account #: [id number] Procedure:                Colonoscopy Indications:              Screening for colorectal malignant neoplasm Medicines:                Monitored Anesthesia Care Procedure:                Pre-Anesthesia Assessment:                           - Prior to the procedure, a History and Physical                            was performed, and patient medications and                            allergies were reviewed. The patient's tolerance of                            previous anesthesia was also reviewed. The risks                            and benefits of the procedure and the sedation                            options and risks were discussed with the patient.                            All questions were answered, and informed consent                            was obtained. Prior Anticoagulants: The patient has                            taken no previous anticoagulant or antiplatelet                            agents. ASA Grade Assessment: II - A patient with                            mild systemic disease. After reviewing the risks                            and benefits, the patient was deemed in                            satisfactory condition to undergo the procedure.                           After obtaining informed consent, the colonoscope  was passed under direct vision. Throughout the                            procedure, the patient's blood pressure, pulse, and                            oxygen saturations were monitored continuously. The                            Olympus CF-[id number]) Colonoscope was                            introduced through the anus and advanced to the the                            terminal  ileum. The colonoscopy was performed                            without difficulty. The patient tolerated the                            procedure well. The quality of the bowel                            preparation was adequate. The terminal ileum,                            ileocecal valve, appendiceal orifice, and rectum                            were photographed. Scope In: 4:38:51 PM Scope Out: 5:00:44 PM Scope Withdrawal Time: 0 hours 17 minutes 10 seconds  Total Procedure Duration: 0 hours 21 minutes 53 seconds  Findings:                 The terminal ileum appeared normal.                           Two sessile polyps were found in the cecum. The                            polyps were 3 to 5 mm in size. These polyps were                            removed with a cold snare. Resection and retrieval                            were complete.                           A few diverticula were found in the sigmoid colon.                           Non-bleeding internal hemorrhoids were found during  retroflexion. Complications:            No immediate complications. Estimated Blood Loss:     Estimated blood loss was minimal. Impression:               - The examined portion of the ileum was normal.                           - Two 3 to 5 mm polyps in the cecum, removed with a                            cold snare. Resected and retrieved.                           - Diverticulosis in the sigmoid colon.                           - Non-bleeding internal hemorrhoids. Recommendation:           - Discharge patient to home (with escort).                           - Await pathology results.                           - The findings and recommendations were discussed                            with the patient. Sonny Masters "Alicia Bishop,  03/17/2021 5:06:36 PM

## 2021-03-17 NOTE — Progress Notes (Signed)
Sedate, gd SR, tolerated procedure well, VSS, report to RN 

## 2021-03-17 NOTE — Op Note (Signed)
[facility] Patient Name: Alicia Bishop Procedure Date: 03/17/2021 4:25 PM MRN: [id number] Endoscopist: Sonny Masters "Alicia Bishop ,  Age: 55 Referring MD:  Date of Birth: 1965-12-27 Gender: Female Account #: [id number] Procedure:                Upper GI endoscopy Indications:              Epigastric abdominal pain, Dysphagia Medicines:                Monitored Anesthesia Care Procedure:                Pre-Anesthesia Assessment:                           - Prior to the procedure, a History and Physical                            was performed, and patient medications and                            allergies were reviewed. The patient's tolerance of                            previous anesthesia was also reviewed. The risks                            and benefits of the procedure and the sedation                            options and risks were discussed with the patient.                            All questions were answered, and informed consent                            was obtained. Prior Anticoagulants: The patient has                            taken no previous anticoagulant or antiplatelet                            agents. ASA Grade Assessment: II - A patient with                            mild systemic disease. After reviewing the risks                            and benefits, the patient was deemed in                            satisfactory condition to undergo the procedure.                           After obtaining informed consent, the endoscope was  passed under direct vision. Throughout the                            procedure, the patient's blood pressure, pulse, and                            oxygen saturations were monitored continuously. The                            GIF HQ190 #[id number] was introduced through the                            mouth, and advanced to the second part of duodenum.                            The upper GI  endoscopy was accomplished without                            difficulty. The patient tolerated the procedure                            well. Scope In: Scope Out: Findings:                 The examined esophagus was normal. Biopsies were                            taken with a cold forceps for histology.                           Diffuse severely erythematous mucosa without                            bleeding was found in the gastric fundus and in the                            gastric body. Biopsies were taken with a cold                            forceps for histology.                           The examined duodenum was normal. Biopsies were                            taken with a cold forceps for histology. Complications:            No immediate complications. Estimated Blood Loss:     Estimated blood loss was minimal. Impression:               - Normal esophagus. Biopsied.                           - Erythematous mucosa in the gastric fundus and  gastric body. Biopsied.                           - Normal examined duodenum. Biopsied. Recommendation:           - Await pathology results.                           - Use Protonix (pantoprazole) 40 mg PO BID for 8                            weeks.                           - Perform a colonoscopy today. Sonny Masters "Alicia Bishop,  03/17/2021 5:03:54 PM

## 2021-03-17 NOTE — Progress Notes (Signed)
Called to room to assist during endoscopic procedure.  Patient ID and intended procedure confirmed with present staff. Received instructions for my participation in the procedure from the performing physician.  

## 2021-03-17 NOTE — Patient Instructions (Signed)
Take your new stomach medication teice daily 1/2 hr before breakfast and supper. For 8 weeks.  Read all of the handouts given to you by your recovery room nurse.  YOU HAD AN ENDOSCOPIC PROCEDURE TODAY AT [facility]:   Refer to the procedure report that was given to you for any specific questions about what was found during the examination.  If the procedure report does not answer your questions, please call your gastroenterologist to clarify.  If you requested that your care partner not be given the details of your procedure findings, then the procedure report has been included in a sealed envelope for you to review at your convenience later.  YOU SHOULD EXPECT: Some feelings of bloating in the abdomen. Passage of more gas than usual.  Walking can help get rid of the air that was put into your GI tract during the procedure and reduce the bloating. If you had a lower endoscopy (such as a colonoscopy or flexible sigmoidoscopy) you may notice spotting of blood in your stool or on the toilet paper. If you underwent a bowel prep for your procedure, you may not have a normal bowel movement for a few days.  Please Note:  You might notice some irritation and congestion in your nose or some drainage.  This is from the oxygen used during your procedure.  There is no need for concern and it should clear up in a day or so.  SYMPTOMS TO REPORT IMMEDIATELY:  Following lower endoscopy (colonoscopy or flexible sigmoidoscopy):  Excessive amounts of blood in the stool  Significant tenderness or worsening of abdominal pains  Swelling of the abdomen that is new, acute  Fever of 100F or higher  Following upper endoscopy (EGD)  Vomiting of blood or coffee ground material  New chest pain or pain under the shoulder blades  Painful or persistently difficult swallowing  New shortness of breath  Fever of 100F or higher  Black, tarry-looking stools  For urgent or emergent issues, a gastroenterologist  can be reached at any hour by calling [phone]. Do not use MyChart messaging for urgent concerns.    DIET:  We do recommend a small meal at first, but then you may proceed to your regular diet.  Drink plenty of fluids but you should avoid alcoholic beverages for 24 hours.  ACTIVITY:  You should plan to take it easy for the rest of today and you should NOT DRIVE or use heavy machinery until tomorrow (because of the sedation medicines used during the test).    FOLLOW UP: Our staff will call the number listed on your records 48-72 hours following your procedure to check on you and address any questions or concerns that you may have regarding the information given to you following your procedure. If we do not reach you, we will leave a message.  We will attempt to reach you two times.  During this call, we will ask if you have developed any symptoms of COVID 19. If you develop any symptoms (ie: fever, flu-like symptoms, shortness of breath, cough etc.) before then, please call [phone].  If you test positive for Covid 19 in the 2 weeks post procedure, please call and report this information to [place].    If any biopsies were taken you will be contacted by phone or by letter within the next 1-3 weeks.  Please call us at [phone] if you have not heard about the biopsies in 3 weeks.    SIGNATURES/CONFIDENTIALITY: You  and/or your care partner have signed paperwork which will be entered into your electronic medical record.  These signatures attest to the fact that that the information above on your After Visit Summary has been reviewed and is understood.  Full responsibility of the confidentiality of this discharge information lies with you and/or your care-partner.

## 2021-03-17 NOTE — Progress Notes (Signed)
Pt's states no medical or surgical changes since previsit or office visit. VS by DT. °

## 2021-03-17 NOTE — Progress Notes (Signed)
GASTROENTEROLOGY PROCEDURE H&P NOTE   Primary Care Physician: Mack Hook, MD    Reason for Procedure:   Epigastric ab pain, dysphagia, diarrhea, colon cancer screening  Plan:    EGD/colonoscopy  Patient is appropriate for endoscopic procedure(s) in the ambulatory ([place]) setting.  The nature of the procedure, as well as the risks, benefits, and alternatives were carefully and thoroughly reviewed with the patient. Ample time for discussion and questions allowed. The patient understood, was satisfied, and agreed to proceed.     HPI: Alicia Bishop is a 55 y.o. female who presents for EGD/colonoscopy for evaluation of epigastric ab pain, dysphagia, diarrhea, colon cancer screening .  Patient was most recently seen in the Gastroenterology Clinic on 02/14/21.  No interval change in medical history since that appointment. Please refer to that note for full details regarding GI history and clinical presentation.   Past Medical History:  Diagnosis Date   Obesity 12/2015   Prediabetes 12/2015   A1C 6.4%   Seasonal allergies     History reviewed. No pertinent surgical history.  Prior to Admission medications   Medication Sig Start Date End Date Taking? Authorizing Provider  amLODipine (NORVASC) 10 MG tablet Take 10 mg by mouth daily.   Yes [provider]  pantoprazole (PROTONIX) 40 MG tablet Take 1 tablet (40 mg total) by mouth daily. 01/19/21  Yes Charlann Lange, PA-C    Current Outpatient Medications  Medication Sig Dispense Refill   amLODipine (NORVASC) 10 MG tablet Take 10 mg by mouth daily.     pantoprazole (PROTONIX) 40 MG tablet Take 1 tablet (40 mg total) by mouth daily. 60 tablet 0   Current Facility-Administered Medications  Medication Dose Route Frequency Provider Last Rate Last Admin   0.9 %  sodium chloride infusion  500 mL Intravenous Once Sharyn Creamer, MD        Allergies as of 03/17/2021   (No Known Allergies)    Family History  Problem  Relation Age of Onset   Hypertension Father    Colon cancer Neg Hx    Esophageal cancer Neg Hx    Liver cancer Neg Hx    Liver disease Neg Hx    Ovarian cancer Neg Hx    Pancreatic cancer Neg Hx    Prostate cancer Neg Hx    Rectal cancer Neg Hx    Stomach cancer Neg Hx     Social History   Socioeconomic History   Marital status: Married    Spouse name: Bettey Costa   Number of children: 4   Years of education: 5   Highest education level: Not on file  Occupational History   Occupation: Housewife  Tobacco Use   Smoking status: Never   Smokeless tobacco: Never  Vaping Use   Vaping Use: Never used  Substance and Sexual Activity   Alcohol use: No   Drug use: No   Sexual activity: Yes    Partners: Male    Birth control/protection: Post-menopausal  Other Topics Concern   Not on file  Social History Narrative   Originally from [place]   Came to [place] In 2003   Lives at home with her husband, and 3 children.   Raelyn Number,  lives in [place]   Husband is a Astronomer at a country club.   Social Determinants of Health   Financial Resource Strain: Not on file  Food Insecurity: Not on file  Transportation Needs: Not on file  Physical Activity: Not on file  Stress: Not  on file  Social Connections: Not on file  Intimate Partner Violence: Not on file    Physical Exam: Vital signs in last 24 hours: BP 117/73   Pulse 75   Temp (!) 96.9 F (36.1 C)   Ht 5\' 2"  (1.575 m)   Wt 170 lb (77.1 kg)   LMP  (LMP Unknown)   SpO2 98%   BMI 31.09 kg/m  GEN: NAD EYE: Sclerae anicteric ENT: MMM CV: Non-tachycardic Pulm: No increased WOB GI: Soft NEURO:  Alert & Oriented   [name], MD [facility]   03/17/2021 3:19 PM

## 2021-03-20 ENCOUNTER — Other Ambulatory Visit: Payer: Self-pay

## 2021-03-21 ENCOUNTER — Telehealth: Payer: Self-pay | Admitting: *Deleted

## 2021-03-21 ENCOUNTER — Telehealth: Payer: Self-pay

## 2021-03-21 NOTE — Telephone Encounter (Signed)
Called [phone] and left a message we tried to reach pt for a follow up call. maw

## 2021-03-21 NOTE — Telephone Encounter (Signed)
Unable to call for post procedure call back. Pt needs interpreter.

## 2021-03-23 ENCOUNTER — Other Ambulatory Visit: Payer: Self-pay

## 2021-03-23 ENCOUNTER — Encounter: Payer: Self-pay | Admitting: Internal Medicine

## 2021-03-23 DIAGNOSIS — A048 Other specified bacterial intestinal infections: Secondary | ICD-10-CM

## 2021-03-23 NOTE — Progress Notes (Signed)
Hi Ammie, please let the patient know that her stomach biopsies were positive for H pylori. Recommend bismuth quadruple therapy for treatment: - Tetracycline 500 mg QID x 14 days - Flagyl 250 mg QID x 14 days - Bismuth subsalicylate 360 mg QID x 14 days - PPI BID x 14 days  About 4 weeks after completion of therapy, patient will need to be checked for eradication with a stool H pylori antigen. PPI therapy will need to be held 2 weeks prior to checking for eradication.

## 2021-03-24 ENCOUNTER — Other Ambulatory Visit: Payer: Self-pay

## 2021-03-24 DIAGNOSIS — A048 Other specified bacterial intestinal infections: Secondary | ICD-10-CM

## 2021-03-24 MED ORDER — TETRACYCLINE HCL 500 MG PO CAPS: 500.0000 mg | ORAL_CAPSULE | Freq: Four times a day (QID) | ORAL | 0 refills | Status: AC

## 2021-03-24 MED ORDER — METRONIDAZOLE 250 MG PO TABS: 250.0000 mg | ORAL_TABLET | Freq: Four times a day (QID) | ORAL | 0 refills | Status: AC

## 2021-03-24 MED ORDER — BISMUTH SUBSALICYLATE 262 MG PO CHEW: 524.0000 mg | CHEWABLE_TABLET | Freq: Four times a day (QID) | ORAL | 0 refills | Status: AC

## 2021-04-04 ENCOUNTER — Telehealth: Payer: Self-pay

## 2021-04-04 NOTE — Telephone Encounter (Signed)
-----   Message from Stevan Born, [place] sent at 02/14/2021  4:34 PM EST ----- Regarding: 3 month flup Pt needs 3 month flup with Dr Lorenso Courier dx diarrhea, epid abd pain, dysphagia, nausea in Feb 2023

## 2021-04-04 NOTE — Telephone Encounter (Signed)
Thru the interpreting line they left her a detailed message to call us back to set up a February appointment with Dr Lorenso Courier. The # to reach the interpreting service is [phone], access code: [phone].

## 2021-04-24 NOTE — Telephone Encounter (Signed)
After holding for 27 minutes for an interrupter no one came on the line. I will have to try back later.

## 2021-04-26 ENCOUNTER — Telehealth: Payer: Self-pay | Admitting: Internal Medicine

## 2021-04-26 ENCOUNTER — Other Ambulatory Visit: Payer: Self-pay

## 2021-04-26 ENCOUNTER — Other Ambulatory Visit: Payer: No Typology Code available for payment source

## 2021-04-26 DIAGNOSIS — A048 Other specified bacterial intestinal infections: Secondary | ICD-10-CM

## 2021-04-26 MED ORDER — DOXYCYCLINE HYCLATE 100 MG PO TABS: 100.0000 mg | ORAL_TABLET | Freq: Two times a day (BID) | ORAL | 0 refills | Status: DC

## 2021-04-26 NOTE — Telephone Encounter (Signed)
Inbound call from patient's daughter stating pantoprazole is over $150 and is requesting something more affordable to be sent to pharmacy in chart please.

## 2021-04-26 NOTE — Telephone Encounter (Signed)
Returned pt call to inquire further. Provided pt with website, [email or web address]. Advised she utilize this website to print a coupon for the Rx. Advised this website will list pharmacies who carry the medication and their cost for the medication WITH the Good Rx coupon. Pt also stated she could not afford Tetracycline and did not take the medication. States she has completed her tx't and wanted to know if there was an alternative to Tetracycline. Advised the alternative would be Doxycycline. However, since she completed her tx't WITHOUT taking Tetracycline, I am not confident that the Hpylori bacteria will be completely eradicated. Unfortunately, will not know this until submission of the stool specimen and the results having been received for further review. Advised I will forward this message to Dr. Lorenso Courier to determine if she would like pt to take Doxycycline with having completed previous medications, or if she would rather have pt submit stool specimen with the understanding that the results may reflect that she will require re-treatment. Verbalized acceptance and understanding.

## 2021-04-27 ENCOUNTER — Other Ambulatory Visit: Payer: Self-pay

## 2021-04-27 NOTE — Telephone Encounter (Signed)
Stool study not needed until 05/10/21. Will await results to determine if pt requires re-treatment.

## 2021-05-03 NOTE — Telephone Encounter (Signed)
As I was unable to reach the patient so I have mailed her a letter to please call to get a f/u appointment with Dr Lorenso Courier.

## 2021-05-05 ENCOUNTER — Encounter: Payer: Self-pay | Admitting: Internal Medicine

## 2021-05-10 ENCOUNTER — Other Ambulatory Visit: Payer: No Typology Code available for payment source

## 2021-05-10 DIAGNOSIS — A048 Other specified bacterial intestinal infections: Secondary | ICD-10-CM

## 2021-05-11 LAB — HELICOBACTER PYLORI  SPECIAL ANTIGEN
MICRO NUMBER:: 12949309
SPECIMEN QUALITY: ADEQUATE

## 2021-06-09 ENCOUNTER — Encounter: Payer: Self-pay | Admitting: *Deleted

## 2021-06-16 ENCOUNTER — Encounter: Payer: Self-pay | Admitting: Physician Assistant

## 2021-06-16 ENCOUNTER — Ambulatory Visit: Payer: No Typology Code available for payment source | Admitting: Physician Assistant

## 2021-06-16 NOTE — Telephone Encounter (Signed)
Army Melia - daughter - [phone] - is able to interpret for her. ?

## 2021-07-03 ENCOUNTER — Ambulatory Visit: Payer: Self-pay | Admitting: Physician Assistant

## 2021-08-04 ENCOUNTER — Ambulatory Visit (INDEPENDENT_AMBULATORY_CARE_PROVIDER_SITE_OTHER): Payer: Self-pay | Admitting: Physician Assistant

## 2021-08-04 ENCOUNTER — Encounter: Payer: Self-pay | Admitting: Physician Assistant

## 2021-08-04 VITALS — BP 118/76 | HR 65 | Ht 62.0 in | Wt 155.0 lb

## 2021-08-04 DIAGNOSIS — D369 Benign neoplasm, unspecified site: Secondary | ICD-10-CM

## 2021-08-04 DIAGNOSIS — R197 Diarrhea, unspecified: Secondary | ICD-10-CM

## 2021-08-04 DIAGNOSIS — A048 Other specified bacterial intestinal infections: Secondary | ICD-10-CM

## 2021-08-04 DIAGNOSIS — K2970 Gastritis, unspecified, without bleeding: Secondary | ICD-10-CM

## 2021-08-04 DIAGNOSIS — R1013 Epigastric pain: Secondary | ICD-10-CM

## 2021-08-04 MED ORDER — DICYCLOMINE HCL 10 MG PO CAPS: ORAL_CAPSULE | ORAL | 6 refills | Status: AC

## 2021-08-04 MED ORDER — PANTOPRAZOLE SODIUM 40 MG PO TBEC
40.0000 mg | DELAYED_RELEASE_TABLET | Freq: Every morning | ORAL | 6 refills | Status: DC
Start: 2021-08-04 — End: 2021-09-15

## 2021-08-04 NOTE — Patient Instructions (Addendum)
If you are age 54 or younger, your body mass index should be between 19-25. Your Body mass index is 28.35 kg/m?Marland Kitchen If this is out of the aformentioned range listed, please consider follow up with your Primary Care Provider.  ?________________________________________________________ ? ?The [facility] providers would like to encourage you to use [facility] to communicate with providers for non-urgent requests or questions.  Due to long hold times on the telephone, sending your provider a message by [facility] may be a faster and more efficient way to get a response.  Please allow 48 business hours for a response.  Please remember that this is for non-urgent requests.  ?_______________________________________________________ ? ?Decrease Pantoprazole 40 mg to 1 tablet every morning ? ?START Dicyclomine 10 mg 1 capsule every morning, may take a scond capsule in the afternoon as needed for diarrhea. ? ?You have been scheduled to follow up with Dr. Lorenso Courier on September 15, 2021 at 8:50 am on the 2nd floor. ? ?Thank you for entrusting me with your care and choosing [facility]. ? ?Nicoletta Ba, PA-C ?

## 2021-08-04 NOTE — Progress Notes (Signed)
? ?Subjective:  ? ? Patient ID: Alicia Bishop, female    DOB: 01/22/1966, 56 y.o.   MRN: [id number] ? ?HPI ? Alicia Bishop is a 56 year old [place] female, non-English-speaking, established with Dr. Lorenso Courier, who comes in today for follow-up and with complaints of epigastric discomfort. ?She was initially evaluated in November 2022 and then underwent EGD and colonoscopy for complaints of epigastric pain and dysphagia as well as intermittent diarrhea. ?EGD revealed diffuse gastritis, normal esophagus and normal duodenum.  Biopsies of the stomach were positive for H. pylori associated gastritis, esophageal biopsy showed mild reflux esophagitis no increased eosinophilia and normal duodenal biopsies. ?Colonoscopy with finding of two 3 to 5 mm sessile polyps in the cecum, and a few sigmoid diverticuli and internal hemorrhoids.  The polyps were tubular adenomas, and random biopsies showed focal active colitis with increased lymphocytes however there was no evidence of thickened subepithelial collagen and much less lymphocytic infiltration than what is normally seen with lymphocytic colitis and therefore felt to represent drug effect versus infection. ?Patient was treated with quadruple therapy with tetracycline, metronidazole, bismuth and PPI.  She apparently did not take the tetracycline as was too expensive.  She did undergo H. pylori stool antigen in February 2023 which was negative. ? ?Overall she says that her stomach feels better than it did however she is still having some abdominal discomfort after eating which is located in the epigastrium, and has a sensation of feeling full and uncomfortable frequently after eating like her food is not digesting.  She is not having any nausea or vomiting, appetite has been okay weight has been stable.  Denies any heartburn or indigestion, bowel movements are normal some days, she says generally when she gets an increase in the epigastric discomfort that will lead to an episode of  diarrhea which occurs at least once per week with multiple episodes of loose stools on that day followed by a day or 2 of no bowel movements.  She is not aware of any particular food triggers though on further questioning she does appear to at least have some sensitivity to lactose and generally avoids.  The symptoms have been present for about 5 years altogether. ? ?Review of Systems Pertinent positive and negative review of systems were noted in the above HPI section.  All other review of systems was otherwise negative.  ? ?Outpatient Encounter Medications as of 08/04/2021  ?Medication Sig  ? amLODipine (NORVASC) 10 MG tablet Take 10 mg by mouth daily.  ? dicyclomine (BENTYL) 10 MG capsule Take 1 capsule every morning. May take a second capsule in the afternoon as needed for diarrhea  ? [DISCONTINUED] pantoprazole (PROTONIX) 40 MG tablet Take 1 tablet (40 mg total) by mouth 2 (two) times daily.  ? pantoprazole (PROTONIX) 40 MG tablet Take 1 tablet (40 mg total) by mouth in the morning.  ? ?No facility-administered encounter medications on file as of 08/04/2021.  ? ?No Known Allergies ?Patient Active Problem List  ? Diagnosis Date Noted  ? Bacterial infection due to H. pylori 08/04/2021  ? Adenomatous polyps 08/04/2021  ? Epigastric pain 07/29/2019  ? Elevated BP without diagnosis of hypertension 12/30/2018  ? Overweight (BMI 25.0-29.9) 12/19/2015  ? Prediabetes 09/30/2014  ? Annual physical exam 09/30/2014  ? Family history of breast cancer in female 09/30/2014  ? Preventative health care 01/28/2013  ? ?Social History  ? ?Socioeconomic History  ? Marital status: Married  ?  Spouse name: Bettey Costa  ? Bishop of children: 4  ?  Years of education: 5  ? Highest education level: Not on file  ?Occupational History  ? Occupation: Housewife  ?Tobacco Use  ? Smoking status: Never  ? Smokeless tobacco: Never  ?Vaping Use  ? Vaping Use: Never used  ?Substance and Sexual Activity  ? Alcohol use: No  ? Drug use: No  ?  Sexual activity: Yes  ?  Partners: Male  ?  Birth control/protection: Post-menopausal  ?Other Topics Concern  ? Not on file  ?Social History Narrative  ? Originally from [place]  ? [place]ame [place] In [place]  ? Lives at home with her husband, and 3 children.  ? Alicia Bishop,  lives in [place]  ? Husband is a Astronomer at a country club.  ? ?Social Determinants of Health  ? ?Financial Resource Strain: Not on file  ?Food Insecurity: Not on file  ?Transportation Needs: Not on file  ?Physical Activity: Not on file  ?Stress: Not on file  ?Social Connections: Not on file  ?Intimate Partner Violence: Not on file  ? ? ?Ms. Gagliardo's family history includes Hypertension in her father. ? ? ?   ?Objective:  ?  ?Vitals:  ? 08/04/21 0830  ?BP: 118/76  ?Pulse: 65  ?SpO2: 99%  ? ? ?Physical Exam Well-developed well-nourished non-English-speaking [place] female in no acute distress.  Accompanied by her daughter and an interpreter  height, Weight, 155 BMI 28.3 ? ?HEENT; nontraumatic normocephalic, EOMI, PE R LA, sclera anicteric. ?Oropharynx; not examined today ?Neck; supple, no JVD ?Cardiovascular; regular rate and rhythm with S1-S2, no murmur rub or gallop ?Pulmonary; Clear bilaterally ?Abdomen; soft, there is some mild tenderness in the epigastrium, nondistended, no palpable mass or hepatosplenomegaly, bowel sounds are active ?Rectal; not done today ?Skin; benign exam, no jaundice rash or appreciable lesions ?Extremities; no clubbing cyanosis or edema skin warm and dry ?Neuro/Psych; alert and oriented x4, grossly nonfocal mood and affect appropriate  ? ? ? ?   ?Assessment & Plan:  ? ?#77 56 year old non-English-speaking [place] female with history of H. pylori induced gastropathy, status post treatment December 2022, follow-up H. pylori stool antigen - February 2023. ?Patient comes back today stating abdominal symptoms are improved over when she was initially seen but she is still having upper abdominal discomfort frequently after eating and a  full feeling.  In addition she has episodes of this upper abdominal discomfort followed by a day of diarrhea with several bowel movements during that day with episodes occurring about once per week. ? ?Rule out persistent gastritis, ?Suspect component of IBS. ?Note that random colon biopsies did show a focal active colitis with increase in lymphocytes, though not diagnostic for a microscopic colitis ? ?#2 adenomatous colon polyps-colonoscopy December 2022-7-year interval follow-up ? ?#3 diverticulosis ?#4 internal hemorrhoids ? ?Plan; Will restart a trial of Protonix 40 mg once daily p.o. every morning AC breakfast ?Start trial of dicyclomine 10 mg p.o. every morning, she can take a second dose as needed on days with diarrhea.  Hopefully the dicyclomine will help avert these episodes. ?She was advised to avoid lactose ?Follow-up office visit with Dr. Lorenso Courier in about 6 weeks ? ?Kortlynn Poust S Johnney Scarlata PA-C ?08/04/2021 ? ? ?Cc: Mack Hook, MD ?  ?

## 2021-08-08 NOTE — Progress Notes (Signed)
I agree with the assessment and plan as outlined by [name]. 

## 2021-09-15 ENCOUNTER — Encounter: Payer: Self-pay | Admitting: Internal Medicine

## 2021-09-15 ENCOUNTER — Other Ambulatory Visit (INDEPENDENT_AMBULATORY_CARE_PROVIDER_SITE_OTHER): Payer: Self-pay

## 2021-09-15 ENCOUNTER — Ambulatory Visit (INDEPENDENT_AMBULATORY_CARE_PROVIDER_SITE_OTHER): Payer: Self-pay | Admitting: Internal Medicine

## 2021-09-15 DIAGNOSIS — Z8619 Personal history of other infectious and parasitic diseases: Secondary | ICD-10-CM

## 2021-09-15 DIAGNOSIS — K219 Gastro-esophageal reflux disease without esophagitis: Secondary | ICD-10-CM

## 2021-09-15 DIAGNOSIS — R7303 Prediabetes: Secondary | ICD-10-CM

## 2021-09-15 LAB — HEMOGLOBIN A1C: Hgb A1c MFr Bld: 6 % (ref 4.6–6.5)

## 2021-09-15 MED ORDER — PANTOPRAZOLE SODIUM 40 MG PO TBEC: 40.0000 mg | DELAYED_RELEASE_TABLET | Freq: Every morning | ORAL | 6 refills | Status: AC

## 2021-09-15 NOTE — Progress Notes (Signed)
Chief Complaint: Ab pain, diarrhea  HPI : 56 year old female with history of obesity presents for follow up of ab pain and diarrhea. Patient is from [place]  Interval History: She is taking the pantoprazole 40 mg QD but ran out of the medication recently.  She does think that the pantoprazole helped with her stomach issues.  Denies ab pain, chest burning, regurgitation, and diarrhea. Only had diarrhea one time since her last visit. She is using the dicyclomine and seems to be helping as well. Denies blood in the stools.  Wt Readings from Last 3 Encounters:  09/15/21 155 lb (70.3 kg)  08/04/21 155 lb (70.3 kg)  03/17/21 170 lb (77.1 kg)   Current Outpatient Medications  Medication Sig Dispense Refill   amLODipine (NORVASC) 10 MG tablet Take 10 mg by mouth daily.     dicyclomine (BENTYL) 10 MG capsule Take 1 capsule every morning. May take a second capsule in the afternoon as needed for diarrhea 60 capsule 6   pantoprazole (PROTONIX) 40 MG tablet Take 1 tablet (40 mg total) by mouth in the morning. 30 tablet 6   No current facility-administered medications for this visit.    Review of Systems: All systems reviewed and negative except where noted in HPI.   Physical Exam: BP (!) 152/86   Pulse 73   Ht '5\' 2"'$  (1.575 m)   Wt 155 lb (70.3 kg)   LMP  (LMP Unknown)   BMI 28.35 kg/m  Constitutional: Pleasant,well-developed, female in no acute distress. HEENT: Normocephalic and atraumatic. Conjunctivae are normal. No scleral icterus. Cardiovascular: Normal rate, regular rhythm.  Pulmonary/chest: Effort normal and breath sounds normal. No wheezing, rales or rhonchi. Abdominal: Soft, nondistended, tender in the epigastric area. Bowel sounds active throughout. There are no masses palpable. No hepatomegaly. Extremities: No edema Neurological: Alert and oriented to person place and time. Skin: Skin is warm and dry. No rashes noted. Psychiatric: Normal mood and affect. Behavior is  normal.  Labs 07/2019: Lipase 28  Labs 01/2021: CBC nml, BMP unremarkable  Labs 02/2021: GI profile positive for EPEC. O&P negative. TTG IgA negative. CRP negative. TSH unremarkable.  Labs 05/2021: H pylori stool antigen negative.  EGD 03/17/21: - Normal esophagus. Biopsied. - Erythematous mucosa in the gastric fundus and gastric body. Biopsied. - Normal examined duodenum. Biopsied. Path: 1. Surgical [P], duodenal biopsy - DUODENAL MUCOSA WITH NO SPECIFIC HISTOPATHOLOGIC CHANGES - NEGATIVE FOR INCREASED INTRAEPITHELIAL LYMPHOCYTES OR VILLOUS ARCHITECTURAL CHANGES 2. Surgical [P], gastric biopsy - GASTRIC ANTRAL AND OXYNTIC MUCOSA WITH HELICOBACTER PYLORI-ASSOCIATED GASTRITIS (IMMUNOHISTOCHEMICAL STAIN FOR HELICOBACTER PYLORI HIGHLIGHTS RARE H PYLORI ORGANISMS) 3. Surgical [P], esophageal biopsy - ESOPHAGEAL SQUAMOUS MUCOSA WITH MILD VASCULAR CONGESTION, AND FOCAL SQUAMOUS BALLOONING, SUGGESTIVE OF REFLUX ESOPHAGITIS - NEGATIVE FOR INCREASED INTRAEPITHELIAL EOSINOPHILS  Colonoscopy 03/17/21: - The examined portion of the ileum was normal. - Two 3 to 5 mm polyps in the cecum, removed with a cold snare. Resected and retrieved. - Diverticulosis in the sigmoid colon. - Non-bleeding internal hemorrhoids. 4. Surgical [P], colon, cecum, polyp (2) - TUBULAR ADENOMA WITHOUT HIGH-GRADE DYSPLASIA OR MALIGNANCY - OTHER FRAGMENT OF DIMINUTIVE INFLAMMATORY POLYP 5. Surgical [P], colon nos, random sites - FOCAL ACTIVE COLITIS WITH MILD EDEMA AND MILDLY INCREASED INTRAEPITHELIAL LYMPHOCYTES. SEE NOTE - NEGATIVE FOR THICKENED SUBEPITHELIAL COLLAGEN TABLE 5. The degree of intraepithelial lymphocytosis is much less than seen in typical cases of lymphocytic colitis. Differential diagnosis mainly includes infection and drug-effect.  ASSESSMENT AND PLAN: Diarrhea, improved Epigastric abdominal pain, resolved History  of H pylori gastritis, resolved GERD Prediabetes Overall patient appears to be  doing quite well.  She was noted to have H. pylori gastritis and underwent antibiotic therapy.  Subsequent H. pylori stool antigen shows that she has had eradication of the H. pylori infection.  Patient's esophageal biopsies showed that the patient does have signs of reflux esophagitis.  Thus she would likely benefit from some ongoing PPI therapy.  Patient's stool studies showed that she did have an infection with E. coli which is likely contributed to her prior diarrhea issues.  Her epigastric abdominal pain and diarrhea have improved significantly. Will plan to refill her pantoprazole medication, and she can continue to use dicyclomine as needed.  Patient requested that she be screened for diabetes since it has been about 2 years since her last hemoglobin A1c was checked. - Check hemoglobin A1C - Refill pantoprazole 40 mg QD - Continue dicyclomine 20 mg BID  - Next colonoscopy due in 03/2028 - RTC 6 months  [name], MD

## 2021-09-15 NOTE — Patient Instructions (Addendum)
If you are age 54 or older, your body mass index should be between 23-30. Your Body mass index is 28.35 kg/m. If this is out of the aforementioned range listed, please consider follow up with your Primary Care Provider.  If you are age 32 or younger, your body mass index should be between 19-25. Your Body mass index is 28.35 kg/m. If this is out of the aformentioned range listed, please consider follow up with your Primary Care Provider.   ________________________________________________________  The [facility] providers would like to encourage you to use [facility] to communicate with providers for non-urgent requests or questions.  Due to long hold times on the telephone, sending your provider a message by [facility] may be a faster and more efficient way to get a response.  Please allow 48 business hours for a response.  Please remember that this is for non-urgent requests.  _______________________________________________________  Your provider has requested that you go to the basement level for lab work before leaving today. Press "B" on the elevator. The lab is located at the first door on the left as you exit the elevator.  Due to recent changes in healthcare laws, you may see the results of your imaging and laboratory studies on MyChart before your provider has had a chance to review them.  We understand that in some cases there may be results that are confusing or concerning to you. Not all laboratory results come back in the same time frame and the provider may be waiting for multiple results in order to interpret others.  Please give [place] 48 hours in order for your provider to thoroughly review all the results before contacting the office for clarification of your results.   We have sent the following medications to your pharmacy for you to pick up at your convenience:  Pantoprazole '40mg'$  once daily.  You will need a follow up in 6 months (December 2023).  We will contact you to schedule this  appointment.  Thank you for entrusting me with your care and choosing [facility].  Dr Lorenso Courier

## 2022-02-14 ENCOUNTER — Telehealth: Payer: Self-pay

## 2022-02-14 NOTE — Telephone Encounter (Signed)
After several attempts with no success to reach an interpreter with the interpreting service, it was decided to mail a letter to the patient. The number contacted was [phone], access code: [phone], but after holding for more than 10  minutes each call only to find out there was "no [place] interpreter available. Patient advised in letter than follow up appointment with Dr Lorenso Courier is needed.

## 2022-02-14 NOTE — Telephone Encounter (Signed)
-----   Message from [place] sent at 09/15/2021  9:26 AM EDT ----- Regarding: December 2023 6 month, YD, dx GERD

## 2023-07-26 ENCOUNTER — Telehealth: Payer: Self-pay

## 2023-07-26 NOTE — Telephone Encounter (Addendum)
 Telephoned patient using interpreter#[id number]. Left a voice message with [employer] (scholarship) contact information.  Telephoned patient using interpreter#[id number]. Person answers but will not speak. [employer] 08/21/2023
